# Patient Record
Sex: Female | Born: 1980 | Hispanic: No | Marital: Married | Smoking: Never smoker
Health system: Southern US, Community
[De-identification: ages and names within clinical notes are randomized; demographics above are authoritative.]

## PROBLEM LIST (undated history)

## (undated) DIAGNOSIS — O009 Unspecified ectopic pregnancy without intrauterine pregnancy: Secondary | ICD-10-CM

## (undated) DIAGNOSIS — D259 Leiomyoma of uterus, unspecified: Secondary | ICD-10-CM

## (undated) DIAGNOSIS — R0600 Dyspnea, unspecified: Secondary | ICD-10-CM

## (undated) DIAGNOSIS — D649 Anemia, unspecified: Secondary | ICD-10-CM

## (undated) HISTORY — PX: LAPAROSCOPIC SALPINGOOPHERECTOMY: SUR795

## (undated) HISTORY — DX: Leiomyoma of uterus, unspecified: D25.9

## (undated) HISTORY — DX: Unspecified ectopic pregnancy without intrauterine pregnancy: O00.90

---

## 2017-02-09 ENCOUNTER — Emergency Department (HOSPITAL_COMMUNITY)
Admission: EM | Admit: 2017-02-09 | Discharge: 2017-02-09 | Disposition: A | Discharge status: Home / Self Care | Payer: Medicaid Other | Attending: Emergency Medicine | Admitting: Emergency Medicine

## 2017-02-09 ENCOUNTER — Other Ambulatory Visit: Payer: Self-pay

## 2017-02-09 ENCOUNTER — Encounter (HOSPITAL_COMMUNITY): Payer: Self-pay | Admitting: Emergency Medicine

## 2017-02-09 ENCOUNTER — Encounter (HOSPITAL_COMMUNITY): Payer: Self-pay | Admitting: Neurology

## 2017-02-09 ENCOUNTER — Emergency Department (HOSPITAL_COMMUNITY)
Admission: EM | Admit: 2017-02-09 | Discharge: 2017-02-09 | Disposition: A | Discharge status: Home / Self Care | Payer: Medicaid Other | Source: Home / Self Care | Attending: Emergency Medicine | Admitting: Emergency Medicine

## 2017-02-09 ENCOUNTER — Emergency Department (HOSPITAL_COMMUNITY): Payer: Medicaid Other

## 2017-02-09 DIAGNOSIS — R05 Cough: Secondary | ICD-10-CM | POA: Diagnosis not present

## 2017-02-09 DIAGNOSIS — J069 Acute upper respiratory infection, unspecified: Secondary | ICD-10-CM | POA: Insufficient documentation

## 2017-02-09 DIAGNOSIS — Z5321 Procedure and treatment not carried out due to patient leaving prior to being seen by health care provider: Secondary | ICD-10-CM | POA: Diagnosis not present

## 2017-02-09 MED ORDER — BENZONATATE 100 MG PO CAPS: 100.0000 mg | ORAL_CAPSULE | Freq: Three times a day (TID) | ORAL | 0 refills | Status: DC

## 2017-02-09 MED ORDER — AZITHROMYCIN 250 MG PO TABS: 250.0000 mg | ORAL_TABLET | Freq: Every day | ORAL | 0 refills | Status: DC

## 2017-02-09 NOTE — ED Notes (Signed)
No answer for xray x2

## 2017-02-09 NOTE — ED Notes (Signed)
No response when called for vitals.  

## 2017-02-09 NOTE — ED Triage Notes (Signed)
Pt reports sick for 1 week, coughing, sneezing. Went to work, was coughing, saw some blood. Came here last night, but didn't wait b/c of the wait.

## 2017-02-09 NOTE — ED Notes (Signed)
Pt still not in waiting room

## 2017-02-09 NOTE — ED Provider Notes (Signed)
[facility]Y DEPARTMENT Provider Note   CSN: [id number] Arrival date & time: 02/09/17  1011     History   Chief Complaint No chief complaint on file.   HPI Becky Larson is a 36 y.o. female.  HPI  36 year old female presents today with complaints of upper respiratory infection.  Patient reports approximately 1 week ago she developed cough, rhinorrhea, congestion and sneezing.  She notes the symptoms have persisted with no worsening.  She notes that her nasal congestion, fatigue and rhinorrhea has dramatically improved but continues to have a cough.  She reports this is slightly productive, reports some minor associated shortness of breath with cough.  She denies any fevers at home.  She was told that she needed to come to the emergency room by her primary care and by her work.  Patient denies a history of asthma or allergies, she reports family members were sick with the same symptoms and have improved.  Patient denies any history of DVT or PE, denies any significant risk factors.  History reviewed. No pertinent past medical history.  There are no active problems to display for this patient.   Past Surgical History:  Procedure Laterality Date  . TUBAL LIGATION      OB History    No data available       Home Medications    Prior to Admission medications   Medication Sig Start Date End Date Taking? Authorizing Provider  azithromycin (ZITHROMAX) 250 MG tablet Take 1 tablet (250 mg total) by mouth daily. Take first 2 tablets together, then 1 every day until finished. 02/09/17   [place] Depaz, Dellis Filbert, PA-C  benzonatate (TESSALON) 100 MG capsule Take 1 capsule (100 mg total) by mouth every 8 (eight) hours. 02/09/17   Okey Regal, PA-C    Family History No family history on file.  Social History Social History   Tobacco Use  . Smoking status: Never Smoker  . Smokeless tobacco: Never Used  Substance Use Topics  . Alcohol use: Yes    Comment:  socially  . Drug use: No     Allergies   Morphine and related   Review of Systems Review of Systems  All other systems reviewed and are negative.    Physical Exam Updated Vital Signs BP 132/83 (BP Location: Right Arm)   Pulse (!) 105   Temp 98.3 F (36.8 C) (Oral)   Resp 18   Ht 5\' 6"  (1.676 m)   Wt 79.4 kg (175 lb)   LMP 01/19/2017   SpO2 98%   BMI 28.25 kg/m   Physical Exam  Constitutional: She is oriented to person, place, and time. She appears well-developed and well-nourished.  HENT:  Head: Normocephalic and atraumatic.  Right Ear: External ear normal.  Left Ear: External ear normal.  Nose: Nose normal.  Mouth/Throat: Oropharynx is clear and moist. No oropharyngeal exudate.  TMs normal bilateral, rhinorrhea present  Eyes: Conjunctivae are normal. Pupils are equal, round, and reactive to light. Right eye exhibits no discharge. Left eye exhibits no discharge. No scleral icterus.  Neck: Normal range of motion. No JVD present. No tracheal deviation present.  Pulmonary/Chest: Effort normal and breath sounds normal. No stridor. No respiratory distress. She has no wheezes. She has no rales.  Musculoskeletal: She exhibits no edema.  Neurological: She is alert and oriented to person, place, and time. Coordination normal.  Skin: Skin is warm.  Psychiatric: She has a normal mood and affect. Her behavior is normal. Judgment and thought  content normal.  Nursing note and vitals reviewed.    ED Treatments / Results  Labs (all labs ordered are listed, but only abnormal results are displayed) Labs Reviewed - No data to display  EKG  EKG Interpretation None       Radiology Dg Chest 2 View  Result Date: 02/09/2017 CLINICAL DATA:  Cough, shortness of breath EXAM: CHEST  2 VIEW COMPARISON:  None. FINDINGS: Lungs are clear.  No pleural effusion or pneumothorax. The heart is normal in size. Visualized osseous structures are within normal limits. IMPRESSION: Normal chest  radiographs. Electronically Signed   By: Julian Hy M.D.   On: 02/09/2017 12:26    Procedures Procedures (including critical care time)  Medications Ordered in ED Medications - No data to display   Initial Impression / Assessment and Plan / ED Course  I have reviewed the triage vital signs and the nursing notes.  Pertinent labs & imaging results that were available during my care of the patient were reviewed by me and considered in my medical decision making (see chart for details).      Final Clinical Impressions(s) / ED Diagnoses   Final diagnoses:  Upper respiratory tract infection, unspecified type    36 year old female presents today with upper respiratory infection.  She has had 7 days of symptoms, the symptoms seem to be improving with continued productive cough.  Chest x-ray normal, lungs sound clear, reassuring vital signs.  Patient will be given a prescription for antibiotics, encouraged to hold for another 3 days if symptoms do not worsen, return to the emergency room for any new or worsening signs or symptoms with initiation of antibiotic therapy.  Check return precautions, she verbalized understanding and agreement to this plan had no further questions or concerns at the time of discharge.  ED Discharge Orders        Ordered    benzonatate (TESSALON) 100 MG capsule  Every 8 hours     02/09/17 1421    azithromycin (ZITHROMAX) 250 MG tablet  Daily     02/09/17 1422       Okey Regal, PA-C 02/09/17 1423    Mabe, Forbes Cellar, MD 02/09/17 1446

## 2017-02-09 NOTE — Discharge Instructions (Signed)
Please read attached information. If you experience any new or worsening signs or symptoms please return to the emergency room for evaluation. Please follow-up with your primary care provider or specialist as discussed. Please use medication prescribed only as directed and discontinue taking if you have any concerning signs or symptoms.   °

## 2017-02-09 NOTE — ED Notes (Signed)
Called for xray-- no response

## 2017-02-09 NOTE — ED Triage Notes (Signed)
Pt reports nasal congestion, body aches, cough, X few days.

## 2017-02-09 NOTE — ED Notes (Signed)
Pt verbalized understanding discharge instructions and denies any further needs or questions at this time. VS stable, ambulatory and steady gait.   

## 2017-02-09 NOTE — ED Notes (Signed)
No response from pt in lobby 

## 2017-02-09 NOTE — ED Notes (Signed)
Call no answer 

## 2017-11-13 ENCOUNTER — Emergency Department (HOSPITAL_COMMUNITY)
Admission: EM | Admit: 2017-11-13 | Discharge: 2017-11-13 | Disposition: A | Discharge status: Home / Self Care | Payer: Medicaid Other | Attending: Emergency Medicine | Admitting: Emergency Medicine

## 2017-11-13 ENCOUNTER — Emergency Department (HOSPITAL_COMMUNITY)
Admission: EM | Admit: 2017-11-13 | Discharge: 2017-11-14 | Disposition: A | Discharge status: Home / Self Care | Payer: Medicaid Other | Source: Home / Self Care | Attending: Emergency Medicine | Admitting: Emergency Medicine

## 2017-11-13 ENCOUNTER — Encounter (HOSPITAL_COMMUNITY): Payer: Self-pay | Admitting: Emergency Medicine

## 2017-11-13 ENCOUNTER — Emergency Department (HOSPITAL_COMMUNITY): Payer: Medicaid Other

## 2017-11-13 DIAGNOSIS — N939 Abnormal uterine and vaginal bleeding, unspecified: Secondary | ICD-10-CM

## 2017-11-13 DIAGNOSIS — D259 Leiomyoma of uterus, unspecified: Secondary | ICD-10-CM | POA: Diagnosis not present

## 2017-11-13 DIAGNOSIS — N938 Other specified abnormal uterine and vaginal bleeding: Secondary | ICD-10-CM

## 2017-11-13 DIAGNOSIS — D649 Anemia, unspecified: Secondary | ICD-10-CM | POA: Insufficient documentation

## 2017-11-13 DIAGNOSIS — R102 Pelvic and perineal pain: Secondary | ICD-10-CM | POA: Diagnosis not present

## 2017-11-13 DIAGNOSIS — Z5321 Procedure and treatment not carried out due to patient leaving prior to being seen by health care provider: Secondary | ICD-10-CM

## 2017-11-13 LAB — URINALYSIS, ROUTINE W REFLEX MICROSCOPIC
BILIRUBIN URINE: NEGATIVE
GLUCOSE, UA: NEGATIVE mg/dL
KETONES UR: NEGATIVE mg/dL
Leukocytes, UA: NEGATIVE
Nitrite: NEGATIVE
PH: 6 (ref 5.0–8.0)
Protein, ur: NEGATIVE mg/dL

## 2017-11-13 LAB — BASIC METABOLIC PANEL
ANION GAP: 7 (ref 5–15)
BUN: 9 mg/dL (ref 6–20)
CALCIUM: 8.7 mg/dL — AB (ref 8.9–10.3)
CO2: 27 mmol/L (ref 22–32)
Chloride: 104 mmol/L (ref 98–111)
Creatinine, Ser: 0.61 mg/dL (ref 0.44–1.00)
GFR calc Af Amer: >60 mL/min (ref >60)
GLUCOSE: 92 mg/dL (ref 70–99)
Potassium: 3.6 mmol/L (ref 3.5–5.1)
Sodium: 138 mmol/L (ref 135–145)

## 2017-11-13 LAB — CBC WITH DIFFERENTIAL/PLATELET
BASOS PCT: 0 %
Basophils Absolute: 0 10*3/uL (ref 0.0–0.1)
Basophils Absolute: 0 10*3/uL (ref 0.0–0.1)
Basophils Relative: 0 %
EOS ABS: 0.3 10*3/uL (ref 0.0–0.7)
EOS PCT: 3 %
EOS PCT: 4 %
Eosinophils Absolute: 0.4 10*3/uL (ref 0.0–0.7)
HCT: 26.3 % — ABNORMAL LOW (ref 36.0–46.0)
HEMATOCRIT: 23.8 % — AB (ref 36.0–46.0)
Hemoglobin: 7.7 g/dL — ABNORMAL LOW (ref 12.0–15.0)
Hemoglobin: 7.1 g/dL — ABNORMAL LOW (ref 12.0–15.0)
LYMPHS ABS: 1.9 10*3/uL (ref 0.7–4.0)
LYMPHS PCT: 20 %
Lymphocytes Relative: 27 %
Lymphs Abs: 2.5 10*3/uL (ref 0.7–4.0)
MCH: 19.7 pg — AB (ref 26.0–34.0)
MCH: 20.1 pg — AB (ref 26.0–34.0)
MCHC: 29.3 g/dL — AB (ref 30.0–36.0)
MCHC: 29.8 g/dL — ABNORMAL LOW (ref 30.0–36.0)
MCV: 67.3 fL — AB (ref 78.0–100.0)
MCV: 67.2 fL — AB (ref 78.0–100.0)
MONO ABS: 0.5 10*3/uL (ref 0.1–1.0)
MONOS PCT: 5 %
MONOS PCT: 5 %
Monocytes Absolute: 0.5 10*3/uL (ref 0.1–1.0)
NEUTROS PCT: 64 %
Neutro Abs: 6.6 10*3/uL (ref 1.7–7.7)
Neutro Abs: 5.7 10*3/uL (ref 1.7–7.7)
Neutrophils Relative %: 72 %
PLATELETS: 382 10*3/uL (ref 150–400)
Platelets: 360 10*3/uL (ref 150–400)
RBC: 3.91 MIL/uL (ref 3.87–5.11)
RBC: 3.54 MIL/uL — AB (ref 3.87–5.11)
RDW: 19.5 % — AB (ref 11.5–15.5)
RDW: 19.3 % — ABNORMAL HIGH (ref 11.5–15.5)
WBC: 9.4 10*3/uL (ref 4.0–10.5)
WBC: 9.1 10*3/uL (ref 4.0–10.5)

## 2017-11-13 LAB — WET PREP, GENITAL
Sperm: NONE SEEN
Trich, Wet Prep: NONE SEEN
Yeast Wet Prep HPF POC: NONE SEEN

## 2017-11-13 LAB — HCG, QUANTITATIVE, PREGNANCY

## 2017-11-13 LAB — URINALYSIS, MICROSCOPIC (REFLEX)

## 2017-11-13 LAB — POC URINE PREG, ED: Preg Test, Ur: NEGATIVE

## 2017-11-13 LAB — PREPARE RBC (CROSSMATCH)

## 2017-11-13 MED ORDER — SODIUM CHLORIDE 0.9% IV SOLUTION
Freq: Once | INTRAVENOUS | Status: AC
  Administered 2017-11-13: 22:00:00 via INTRAVENOUS

## 2017-11-13 MED ORDER — STERILE WATER FOR INJECTION IJ SOLN
INTRAMUSCULAR | Status: AC
  Filled 2017-11-13: qty 10

## 2017-11-13 MED ORDER — MORPHINE SULFATE (PF) 4 MG/ML IV SOLN
4.0000 mg | Freq: Once | INTRAVENOUS | Status: AC
  Administered 2017-11-13: 4 mg via INTRAVENOUS
  Filled 2017-11-13: qty 1

## 2017-11-13 MED ORDER — DOCUSATE SODIUM 100 MG PO CAPS: 100.0000 mg | ORAL_CAPSULE | Freq: Two times a day (BID) | ORAL | 1 refills | Status: DC

## 2017-11-13 MED ORDER — TRANEXAMIC ACID 650 MG PO TABS: 1300.0000 mg | ORAL_TABLET | Freq: Three times a day (TID) | ORAL | 0 refills | Status: DC

## 2017-11-13 MED ORDER — MEGESTROL ACETATE 40 MG PO TABS
40.0000 mg | ORAL_TABLET | Freq: Every day | ORAL | Status: DC
  Administered 2017-11-13: 40 mg via ORAL
  Filled 2017-11-13 (×2): qty 1

## 2017-11-13 MED ORDER — FERROUS SULFATE 325 (65 FE) MG PO TABS: 325.0000 mg | ORAL_TABLET | Freq: Every day | ORAL | 1 refills | Status: DC

## 2017-11-13 MED ORDER — DIPHENHYDRAMINE HCL 50 MG/ML IJ SOLN
25.0000 mg | Freq: Once | INTRAMUSCULAR | Status: AC
  Administered 2017-11-14: 25 mg via INTRAVENOUS
  Filled 2017-11-13: qty 1

## 2017-11-13 MED ORDER — ESTROGENS CONJUGATED 25 MG IJ SOLR
25.0000 mg | Freq: Once | INTRAMUSCULAR | Status: DC
  Filled 2017-11-13: qty 25

## 2017-11-13 MED ORDER — LACTATED RINGERS IV BOLUS
1000.0000 mL | Freq: Once | INTRAVENOUS | Status: AC
  Administered 2017-11-13: 1000 mL via INTRAVENOUS

## 2017-11-13 MED ORDER — SODIUM CHLORIDE 0.9 % IV BOLUS
1000.0000 mL | Freq: Once | INTRAVENOUS | Status: AC
  Administered 2017-11-13: 1000 mL via INTRAVENOUS

## 2017-11-13 MED ORDER — ESTROGENS CONJUGATED 25 MG IJ SOLR
25.0000 mg | Freq: Once | INTRAMUSCULAR | Status: AC
  Administered 2017-11-13: 25 mg via INTRAVENOUS
  Filled 2017-11-13 (×2): qty 25

## 2017-11-13 MED ORDER — SODIUM CHLORIDE 0.9 % IV BOLUS: 1000.0000 mL | Freq: Once | INTRAVENOUS | Status: DC

## 2017-11-13 MED ORDER — MEGESTROL ACETATE 40 MG PO TABS
120.0000 mg | ORAL_TABLET | Freq: Every day | ORAL | Status: DC
  Filled 2017-11-13: qty 3

## 2017-11-13 NOTE — ED Provider Notes (Addendum)
Patient accepted at signout from Dr. Dayna Barker.  Plan to follow-up on ultrasound results and reassess patient for disposition.  Dr. Dayna Barker had ordered Megace, Premarin and 1 unit of blood based on consultation with Dr. Ilda Basset at [facility].  At this time (18:44), those have not yet been administered or blood started.Marland Kitchen Physical Exam  BP 108/66 (BP Location: Right Arm)   Pulse 91   Temp 98 F (36.7 C) (Oral)   Resp 16   SpO2 100%   Physical Exam  Constitutional: She is oriented to person, place, and time. She appears well-developed and well-nourished. No distress.  HENT:  Head: Normocephalic and atraumatic.  Eyes: EOM are normal.  Pulmonary/Chest: Effort normal.  Abdominal:  Mild lower abdominal distention with palpable uterus below the umbilicus slightly firm.  Mild tenderness.  No guarding.  Patient is wearing menstrual pad with red blood that is been absorbed but no large clot or continuous trickling of blood.  Musculoskeletal: Normal range of motion.  Neurological: She is oriented to person, place, and time. She exhibits normal muscle tone. Coordination normal.  Skin: Skin is warm and dry. There is pallor.  Psychiatric: She has a normal mood and affect.    ED Course/Procedures   Clinical Course as of Nov 13 2129  Sat Nov 13, 2017  4193 I have reassured the patient and gone over the risks and benefits of the transfusion.  She is now agreeable to taking the blood.  Demeanor is pleasant and agreeable.  She was fearful of possibility of transmission of HIV or hepatitis.   [MP]    Clinical Course User Index [MP] Charlesetta Shanks, MD    Procedures   Consult: Reviewed with Dr. Ilda Basset with ultrasound result.  As the Premarin and Megace and blood have not been administered at this time, he advises to continue with that plan and reassess the patient.  Ultrasound findings may well represent fibroid or polyp.  He advises to recontact him once the medications of blood have finished to determine if  bleeding has diminished significantly patient may be suitable for outpatient follow-up. MDM  (18: 76) patient's nurse just advises me that the patient is currently refusing her blood transfusion.  Will go and review this with the patient.  At my first evaluation, patient is pleasant and agreeable.  Did not advise me that she was going to refuse blood transfusion when discussed.   (21: 45) patient's nurse who took over care, reports that blood has just recently been hung.  She reports that type and screen had apparently not been done by the previous nurse provider.  Megace has just come up from the pharmacy.  Patient is only just now getting the Megace.  Nurse reports patient requested something for pelvic pain.  Morphine 4 mg IV added order set. (23: 47) Blood is nearly finished.  Patient has some itching with morphine.  She has one, 1 cm hive on her abdomen.  No erythema and no respiratory distress.  She is alert and clinically well in appearance.  Will give 1 dose Benadryl for itching.  She reports she has continued to have some bleeding she changes pads. No large clots. (00: 35) patient has tolerated blood without adverse effect.  Benadryl relieved itching.  She is alert and appropriate with no respiratory distress.  Plan will be for observation post blood transfusion for minimum of 30 minutes.  I have reviewed with patient plan for follow-up with women[facility] outpatient clinic to call on Monday.  Return precautions  reviewed.  Confirm with Dr. Ilda Basset, patient is to have tranexamic acid 3 times daily for 5 days.  I have modified prescription for dispensation of 15 pills. Charlesetta Shanks, MD 11/13/17 1850    Charlesetta Shanks, MD 11/14/17 4039    Charlesetta Shanks, MD 11/14/17 [phone]

## 2017-11-13 NOTE — ED Triage Notes (Signed)
Patient here from home with complaints of heavy vaginal bleeding. States that last month she missed her period and had a positive pregnancy test. Currently bleeding heavily and passing clots.

## 2017-11-13 NOTE — ED Provider Notes (Signed)
Emergency Department Provider Note   I have reviewed the triage vital signs and the nursing notes.   HISTORY  Chief Complaint Vaginal Bleeding and Threatened Miscarriage   HPI Becky Larson is a 37 y.o. female with a history of tubal ligation who had a positive pregnancy test last month after she did not have it..  She states that she normally has pretty heavy periods and her normal cycle starts her on the 20th the 21st.  This month she started having vaginal bleeding on the 21st and has been going on ever since then.  She had large clots that of passing a large amount of bleeding.  Yesterday she did have an episode of dizziness but otherwise has been asymptomatic.  Has not taken another pregnancy test. No other associated or modifying symptoms.    History reviewed. No pertinent past medical history.  There are no active problems to display for this patient.   Past Surgical History:  Procedure Laterality Date  . TUBAL LIGATION        Allergies Morphine and related  No family history on file.  Social History Social History   Tobacco Use  . Smoking status: Never Smoker  . Smokeless tobacco: Never Used  Substance Use Topics  . Alcohol use: Yes    Comment: socially  . Drug use: No    Review of Systems  All other systems negative except as documented in the HPI. All pertinent positives and negatives as reviewed in the HPI. ____________________________________________   PHYSICAL EXAM:  VITAL SIGNS: ED Triage Vitals [11/13/17 0847]  Enc Vitals Group     BP 123/76     Pulse Rate 86     Resp 18     Temp 97.9 F (36.6 C)     Temp Source Oral     SpO2 99 %    Constitutional: Alert and oriented. Well appearing and in no acute distress. Eyes: Conjunctivae are normal. PERRL. EOMI. Head: Atraumatic. Nose: No congestion/rhinnorhea. Mouth/Throat: Mucous membranes are moist.  Oropharynx non-erythematous. Neck: No stridor.  No meningeal signs.    Cardiovascular: Normal rate, regular rhythm. Good peripheral circulation. Grossly normal heart sounds.   Respiratory: Normal respiratory effort.  No retractions. Lungs CTAB. Gastrointestinal: Soft and nontender. No distention.  Musculoskeletal: No lower extremity tenderness nor edema. No gross deformities of extremities. Neurologic:  Normal speech and language. No gross focal neurologic deficits are appreciated.  Skin:  Skin is warm, dry and intact. No rash noted.   ____________________________________________   LABS (all labs ordered are listed, but only abnormal results are displayed)  Labs Reviewed  URINALYSIS, ROUTINE W REFLEX MICROSCOPIC - Abnormal; Notable for the following components:      Result Value   Specific Gravity, Urine >1.030 (*)    Hgb urine dipstick MODERATE (*)    All other components within normal limits  CBC WITH DIFFERENTIAL/PLATELET - Abnormal; Notable for the following components:   Hemoglobin 7.7 (*)    HCT 26.3 (*)    MCV 67.3 (*)    MCH 19.7 (*)    MCHC 29.3 (*)    RDW 19.5 (*)    All other components within normal limits  BASIC METABOLIC PANEL - Abnormal; Notable for the following components:   Calcium 8.7 (*)    All other components within normal limits  URINALYSIS, MICROSCOPIC (REFLEX) - Abnormal; Notable for the following components:   Bacteria, UA FEW (*)    All other components within normal limits  URINE CULTURE  HCG, QUANTITATIVE, PREGNANCY  POC URINE PREG, ED  TYPE AND SCREEN   ____________________________________________  INITIAL IMPRESSION / ASSESSMENT AND PLAN / ED COURSE  Will see if pregnant and decide course of action from there. No e/o hemorrhagic shock currently but if she has really had amount of bleeding that she states she has I will check a CBC.  Patient's hemoglobin is 7.7.  Will order Premarin and Megace.  On reevaluation patient had eloped.  I called and left a voicemail for her to come back.  Pertinent labs &  imaging results that were available during my care of the patient were reviewed by me and considered in my medical decision making (see chart for details).  ____________________________________________  FINAL CLINICAL IMPRESSION(S) / ED DIAGNOSES  Final diagnoses:  Dysfunctional uterine bleeding  Eloped from emergency department     MEDICATIONS GIVEN DURING THIS VISIT:  Medications  lactated ringers bolus 1,000 mL (0 mLs Intravenous Stopped 11/13/17 1305)     NEW OUTPATIENT MEDICATIONS STARTED DURING THIS VISIT:  There are no discharge medications for this patient.   Note:  This note was prepared with assistance of Dragon voice recognition software. Occasional wrong-word or sound-a-like substitutions may have occurred due to the inherent limitations of voice recognition software.   Merrily Pew, MD 11/13/17 [phone]

## 2017-11-13 NOTE — Discharge Instructions (Addendum)
1.  Return to the emergency department immediately if you have lightheadedness, feel like he will pass out, increasing bleeding. 2.  Call [facility] outpatient clinic Monday.  Let them know you are seen in the emergency department and you are to have follow-up for bleeding that required blood transfusion. 3.  Fill your prescriptions and start your medications tomorrow morning. You must be seen by a Gynecologist early this week to guide your medications for bleeding.

## 2017-11-13 NOTE — ED Provider Notes (Signed)
Emergency Department Provider Note   I have reviewed the triage vital signs and the nursing notes.   HISTORY  Chief Complaint Abnormal Lab   HPI Becky Larson is a 37 y.o. female who returns after elopement earlier in day.   She has a history of tubal ligation who had a positive pregnancy test last month after she did not have it..  She states that she normally has pretty heavy periods and her normal cycle starts her on the 20th the 21st.  This month she started having vaginal bleeding on the 21st and has been going on ever since then.  She had large clots that of passing a large amount of bleeding.  Yesterday she did have an episode of dizziness but otherwise has been asymptomatic.  Has not taken another pregnancy test.  Since leaving the emergency department she still has some abdominal cramping and persistent bleeding and clots at home.  She still feels little bit dizzy.   No other associated or modifying symptoms.    History reviewed. No pertinent past medical history.  There are no active problems to display for this patient.   Past Surgical History:  Procedure Laterality Date  . TUBAL LIGATION        Allergies Morphine and related  No family history on file.  Social History Social History   Tobacco Use  . Smoking status: Never Smoker  . Smokeless tobacco: Never Used  Substance Use Topics  . Alcohol use: Yes    Comment: socially  . Drug use: No    Review of Systems  All other systems negative except as documented in the HPI. All pertinent positives and negatives as reviewed in the HPI. ____________________________________________   PHYSICAL EXAM:  VITAL SIGNS: Blood pressure 112/69, pulse 77, temperature 98.1 F (36.7 C), temperature source Oral, resp. rate 16, SpO2 100 %.   Constitutional: Alert and oriented. Well appearing and in no acute distress. Eyes: Conjunctivae are normal. PERRL. EOMI. Head: Atraumatic. Nose: No  congestion/rhinnorhea. Mouth/Throat: Mucous membranes are moist.  Oropharynx non-erythematous. Neck: No stridor.  No meningeal signs.   Cardiovascular: Normal rate, regular rhythm. Good peripheral circulation. Grossly normal heart sounds.   Respiratory: Normal respiratory effort.  No retractions. Lungs CTAB. Gastrointestinal: Soft and nontender. No distention.  Musculoskeletal: No lower extremity tenderness nor edema. No gross deformities of extremities. Neurologic:  Normal speech and language. No gross focal neurologic deficits are appreciated.  Skin:  Skin is warm, dry and intact. No rash noted. GU: blood in vault, mild oozing from os. No cmt. No friable cervix or obvious masses.  ____________________________________________   LABS (all labs ordered are listed, but only abnormal results are displayed)  Labs Reviewed  WET PREP, GENITAL - Abnormal; Notable for the following components:      Result Value   Clue Cells Wet Prep HPF POC PRESENT (*)    WBC, Wet Prep HPF POC MANY (*)    All other components within normal limits  CBC WITH DIFFERENTIAL/PLATELET - Abnormal; Notable for the following components:   RBC 3.54 (*)    Hemoglobin 7.1 (*)    HCT 23.8 (*)    MCV 67.2 (*)    MCH 20.1 (*)    MCHC 29.8 (*)    RDW 19.3 (*)    All other components within normal limits  PREPARE RBC (CROSSMATCH)  ABO/RH  GC/CHLAMYDIA PROBE AMP ([place]) NOT AT [facility]   ____________________________________________   RADIOLOGY  US Pelvis Complete  Result Date:  11/13/2017 CLINICAL DATA:  Abnormal bleeding.  Evaluate for fibroids EXAM: TRANSABDOMINAL ULTRASOUND OF PELVIS TECHNIQUE: Transabdominal ultrasound examination of the pelvis was performed including evaluation of the uterus, ovaries, adnexal regions, and pelvic cul-de-sac. COMPARISON:  None. FINDINGS: Uterus Measurements: 15.8 x 9.3 x 9.7 cm. Heterogeneous echotexture. Focal intramural fundal fibroid anteriorly measures up to 2.6 cm.  Endometrium Thickness: Markedly thickened, measuring up to 6.4 cm in thickness. Heterogeneous mass noted within the endometrium measures 8.2 x 7.1 x 5.8 cm. Internal blood flow noted. Right ovary Measurements: Not visualized.  No adnexal mass seen. Left ovary Measurements: 4.6 x 2.4 x 2.5 cm. Normal appearance/no adnexal mass. Other findings:  No abnormal free fluid. IMPRESSION: Enlarged, heterogeneous myometrium. Focal fundal fibroid measures 2.6 cm. Large heterogeneous endometrial mass measuring up to 8.2 cm with internal blood flow. Cannot exclude endometrial cancer. Recommend tissue sampling. Electronically Signed   By: Rolm Baptise M.D.   On: 11/13/2017 18:08    ____________________________________________   PROCEDURES  Procedure(s) performed:   Procedures  CRITICAL CARE Performed by: Merrily Pew Total critical care time: 35 minutes Critical care time was exclusive of separately billable procedures and treating other patients. Critical care was necessary to treat or prevent imminent or life-threatening deterioration. Critical care was time spent personally by me on the following activities: development of treatment plan with patient and/or surrogate as well as nursing, discussions with consultants, evaluation of patient's response to treatment, examination of patient, obtaining history from patient or surrogate, ordering and performing treatments and interventions, ordering and review of laboratory studies, ordering and review of radiographic studies, pulse oximetry and re-evaluation of patient's condition.  ____________________________________________   INITIAL IMPRESSION / ASSESSMENT AND PLAN / ED COURSE  Patient's repeat hemoglobin 7.1 and continuing to bleed.  Will start Megace and Premarin and a unit of blood.  Recommended admission to the patient however she really does not want to be admitted.  Compromised and said that if the bleeding stopped and her hemoglobin stayed stable and  she fell the case then she can be discharged.  I discussed with gynecology, after [place] who suggest to send her home on iron and TXA 1300 mg 3 times a day for 5 days but to also do an ultrasound to evaluate for causes of bleeding. At time of transfer of care pending blood and medicines and ultrasound.  Pertinent labs & imaging results that were available during my care of the patient were reviewed by me and considered in my medical decision making (see chart for details).  ____________________________________________  FINAL CLINICAL IMPRESSION(S) / ED DIAGNOSES  Final diagnoses:  Vaginal bleeding  Symptomatic anemia     MEDICATIONS GIVEN DURING THIS VISIT:  Medications  conjugated estrogens (PREMARIN) injection 25 mg (25 mg Intravenous Given 11/13/17 1843)  sodium chloride 0.9 % bolus 1,000 mL (0 mLs Intravenous Stopped 11/13/17 1707)  0.9 %  sodium chloride infusion (Manually program via Guardrails IV Fluids) ( Intravenous Stopped 11/14/17 0142)  morphine 4 MG/ML injection 4 mg (4 mg Intravenous Given 11/13/17 2142)  diphenhydrAMINE (BENADRYL) injection 25 mg (25 mg Intravenous Given 11/14/17 0020)     NEW OUTPATIENT MEDICATIONS STARTED DURING THIS VISIT:  Discharge Medication List as of 11/14/2017  1:09 AM    START taking these medications   Details  docusate sodium (COLACE) 100 MG capsule Take 1 capsule (100 mg total) by mouth 2 (two) times daily. While on iron, Starting Sat 11/13/2017, Print    ferrous sulfate 325 (65 FE) MG tablet Take  1 tablet (325 mg total) by mouth daily., Starting Sat 11/13/2017, Print    tranexamic acid (LYSTEDA) 650 MG TABS tablet Take 2 tablets (1,300 mg total) by mouth 3 (three) times daily., Starting Sat 11/13/2017, Print        Note:  This note was prepared with assistance of Dragon voice recognition software. Occasional wrong-word or sound-a-like substitutions may have occurred due to the inherent limitations of voice recognition software.    Ninette Cotta, Corene Cornea, MD 11/14/17 [phone]

## 2017-11-13 NOTE — ED Notes (Addendum)
Pt states that she is having difficulty breathing-o2 sat at 66. Pt placed on 2L o2.  Pt moved to 94. Pulse monitoring at 48-manual measure 82

## 2017-11-13 NOTE — ED Notes (Signed)
Pelvic swabs completed by EDP [facility]- waiting for order to be placed before sending to lab

## 2017-11-13 NOTE — ED Notes (Signed)
Pt refused blood transfusion

## 2017-11-13 NOTE — ED Triage Notes (Signed)
Patient was here earlier and left AMA. Patient was called to come back due to low HGB.

## 2017-11-13 NOTE — ED Notes (Signed)
Patient left AMA.

## 2017-11-13 NOTE — ED Notes (Signed)
Patient left without notifying nursing staff. Pt pull her iv out and was found in the trash. M.d made aware.

## 2017-11-13 NOTE — ED Notes (Signed)
After EDP spoke with pt, pt room was found to be empty. Pt had pulled IV, placed in trash can and left w/o notifying staff or receiving d/c instructions. Primary RN notified

## 2017-11-14 LAB — ABO/RH: ABO/RH(D): O POS

## 2017-11-14 LAB — TYPE AND SCREEN
ABO/RH(D): O POS
ANTIBODY SCREEN: NEGATIVE
UNIT DIVISION: 0

## 2017-11-14 LAB — BPAM RBC
Blood Product Expiration Date: 201909202359
ISSUE DATE / TIME: 201908242059
Unit Type and Rh: 5100

## 2017-11-14 LAB — URINE CULTURE

## 2017-11-15 LAB — GC/CHLAMYDIA PROBE AMP (~~LOC~~) NOT AT ARMC
CHLAMYDIA, DNA PROBE: NEGATIVE
Neisseria Gonorrhea: NEGATIVE

## 2017-11-25 ENCOUNTER — Other Ambulatory Visit (HOSPITAL_COMMUNITY)
Admission: RE | Admit: 2017-11-25 | Discharge: 2017-11-25 | Disposition: A | Discharge status: Home / Self Care | Payer: Medicaid Other | Source: Ambulatory Visit | Attending: Obstetrics and Gynecology | Admitting: Obstetrics and Gynecology

## 2017-11-25 ENCOUNTER — Encounter (HOSPITAL_COMMUNITY): Payer: Self-pay

## 2017-11-25 ENCOUNTER — Encounter: Payer: Self-pay | Admitting: Obstetrics and Gynecology

## 2017-11-25 ENCOUNTER — Ambulatory Visit (INDEPENDENT_AMBULATORY_CARE_PROVIDER_SITE_OTHER): Payer: Medicaid Other | Admitting: Obstetrics and Gynecology

## 2017-11-25 VITALS — BP 110/67 | HR 92 | Ht 66.0 in | Wt 173.3 lb

## 2017-11-25 DIAGNOSIS — B3731 Acute candidiasis of vulva and vagina: Secondary | ICD-10-CM

## 2017-11-25 DIAGNOSIS — N760 Acute vaginitis: Secondary | ICD-10-CM | POA: Diagnosis not present

## 2017-11-25 DIAGNOSIS — N840 Polyp of corpus uteri: Secondary | ICD-10-CM

## 2017-11-25 DIAGNOSIS — N939 Abnormal uterine and vaginal bleeding, unspecified: Secondary | ICD-10-CM | POA: Insufficient documentation

## 2017-11-25 DIAGNOSIS — B9689 Other specified bacterial agents as the cause of diseases classified elsewhere: Secondary | ICD-10-CM | POA: Diagnosis not present

## 2017-11-25 DIAGNOSIS — B373 Candidiasis of vulva and vagina: Secondary | ICD-10-CM

## 2017-11-25 LAB — POCT PREGNANCY, URINE: Preg Test, Ur: NEGATIVE

## 2017-11-25 MED ORDER — FLUCONAZOLE 150 MG PO TABS: 150.0000 mg | ORAL_TABLET | Freq: Once | ORAL | 0 refills | Status: AC

## 2017-11-25 MED ORDER — NORETHIN ACE-ETH ESTRAD-FE 1-20 MG-MCG(24) PO CAPS: 1.0000 | ORAL_CAPSULE | Freq: Every day | ORAL | 0 refills | Status: DC

## 2017-11-25 MED ORDER — METRONIDAZOLE 500 MG PO TABS: 500.0000 mg | ORAL_TABLET | Freq: Two times a day (BID) | ORAL | 1 refills | Status: AC

## 2017-11-25 NOTE — Progress Notes (Signed)
Lysteda made the bleeding worse, when she stopped taking the meds her bleeding lightened up.

## 2017-11-25 NOTE — Procedures (Signed)
Endometrial Biopsy Procedure Note  Pre-operative Diagnosis: AUB, Fibroid uterus, ?polyp vs intrauterine fibroid  Post-operative Diagnosis: same  Procedure Details  Urine pregnancy test was done and result was negative.  The risks (including infection, bleeding, pain, and uterine perforation) and benefits of the procedure were explained to the patient and Written informed consent was obtained.  The patient was placed in the dorsal lithotomy position.  Bimanual exam showed the uterus to be in the anteroflexed position.  A Graves' speculum inserted in the vagina, and the cervix was visualized and a pap smear performed. The cervix was then prepped with povidone iodine, and a sharp tenaculum was applied to the anterior lip of the cervix for stabilization.  A pipelle was inserted into the uterine cavity and sounded the uterus to a depth of 9cm.  A Small-moderate amount of tissue and blood was collected after 2 passes. The sample was sent for pathologic examination.  Condition: Stable  Complications: None  Plan: The patient was advised to call for any fever or for prolonged or severe pain or bleeding. She was advised to use OTC analgesics as needed for mild to moderate pain. She was advised to avoid vaginal intercourse for 48 hours or until the bleeding has completely stopped.  Durene Romans MD Attending Center for [employer])

## 2017-11-25 NOTE — Progress Notes (Signed)
Obstetrics and Gynecology New Patient Evaluation  Appointment Date: 11/25/2017  OBGYN Clinic: [facility]  Primary Care Provider: Patient, No Pcp Per  Referring Provider: [facility]  Chief Complaint: AUB follow up  History of Present Illness: Becky Larson is a 37 y.o. African-American [phone] (Patient's last menstrual period was 11/10/2017 (exact date).), seen for the above chief complaint.   Patient went to [facility] ED on 8/24 for heavy vaginal bleeding. Patient received IV premarin, megace and 1U PRBC and was discharged to home with Lysteda. Pt states she only took the Lysteda for two days b/c she thought it actually made the bleeding worse. No bleeding since d/c from ED and no s/s of anemia, chest pain, sob, pain  Review of Systems: as noted in the History of Present Illness.  Past Medical History:  Past Medical History:  Diagnosis Date  . Ectopic pregnancy    Right    Past Surgical History:  Past Surgical History:  Procedure Laterality Date  . LAPAROSCOPIC SALPINGOOPHERECTOMY Right    ectopic    Past Obstetrical History:  OB History  Gravida Para Term Preterm AB Living  7 2 2  0 5 2  SAB TAB Ectopic Multiple Live Births  3 2 0 0 2    # Outcome Date GA Lbr Len/2nd Weight Sex Delivery Anes PTL Lv  7 Term           6 Term           5 SAB           4 SAB           3 SAB           2 TAB           1 TAB             Past Gynecological History: As per HPI. Periods: prior to most recent episode, regular, not that heavy or painful History of Pap Smear(s): Yes.   Last pap unsure, which have always been normal She is currently using nothing for contraception.   Social History:  Social History   Socioeconomic History  . Marital status: Married    Spouse name: Not on file  . Number of children: Not on file  . Years of education: Not on file  . Highest education level: Not on file  Occupational History  . Not on file  Social Needs  . Financial  resource strain: Not on file  . Food insecurity:    Worry: Not on file    Inability: Not on file  . Transportation needs:    Medical: Not on file    Non-medical: Not on file  Tobacco Use  . Smoking status: Never Smoker  . Smokeless tobacco: Never Used  Substance and Sexual Activity  . Alcohol use: Yes    Comment: socially  . Drug use: No  . Sexual activity: Yes    Birth control/protection: Surgical  Lifestyle  . Physical activity:    Days per week: Not on file    Minutes per session: Not on file  . Stress: Not on file  Relationships  . Social connections:    Talks on phone: Not on file    Gets together: Not on file    Attends religious service: Not on file    Active member of club or organization: Not on file    Attends meetings of clubs or organizations: Not on file    Relationship status: Not  on file  . Intimate partner violence:    Fear of current or ex partner: Not on file    Emotionally abused: Not on file    Physically abused: Not on file    Forced sexual activity: Not on file  Other Topics Concern  . Not on file  Social History Narrative  . Not on file    Family History: History reviewed. No pertinent family history. She denies any female cancers, bleeding or blood clotting disorders.    Medications Becky Larson. Becky Larson had no medications administered during this visit. Current Outpatient Medications  Medication Sig Dispense Refill  . ferrous sulfate 325 (65 FE) MG tablet Take 1 tablet (325 mg total) by mouth daily. 30 tablet 1  . docusate sodium (COLACE) 100 MG capsule Take 1 capsule (100 mg total) by mouth 2 (two) times daily. While on iron (Patient not taking: Reported on 11/25/2017) 60 capsule 1   No current facility-administered medications for this visit.     Allergies Morphine and related   Physical Exam:  BP 110/67   Pulse 92   Ht 5\' 6"  (1.676 m)   Wt 173 lb 4.8 oz (78.6 kg)   LMP 11/10/2017 (Exact Date)   BMI 27.97 kg/m  Body mass index is  27.97 kg/m. General appearance: Well nourished, well developed female in no acute distress.  Neck:  Supple, normal appearance, and no thyromegaly  Cardiovascular: normal s1 and s2.  No murmurs, rubs or gallops. Respiratory:  Clear to auscultation bilateral. Normal respiratory effort Abdomen: positive bowel sounds and no masses, hernias; diffusely non tender to palpation, non distended Neuro/Psych:  Normal mood and affect.  Skin:  Warm and dry.  Lymphatic:  No inguinal lymphadenopathy.   Pelvic exam: is not limited by body habitus EGBUS: within normal limits, Vagina: within normal limits and with minimal blood or discharge in the vault, Cervix: normal appearing cervix without tenderness, discharge or lesions. Uterus:  enlarged, c/w 12-14 week size and non tender, mobile and Adnexa:  normal adnexa and no mass, fullness, tenderness Rectovaginal: deferred  See procedure note for EMBx. Patient very intolerant of exam.   Laboratory: UPT neg  Radiology:  CLINICAL DATA:  Abnormal bleeding.  Evaluate for fibroids  EXAM: TRANSABDOMINAL ULTRASOUND OF PELVIS  TECHNIQUE: Transabdominal ultrasound examination of the pelvis was performed including evaluation of the uterus, ovaries, adnexal regions, and pelvic cul-de-sac.  COMPARISON:  None.  FINDINGS: Uterus  Measurements: 15.8 x 9.3 x 9.7 cm. Heterogeneous echotexture. Focal intramural fundal fibroid anteriorly measures up to 2.6 cm.  Endometrium  Thickness: Markedly thickened, measuring up to 6.4 cm in thickness. Heterogeneous mass noted within the endometrium measures 8.2 x 7.1 x 5.8 cm. Internal blood flow noted.  Right ovary  Measurements: Not visualized.  No adnexal mass seen.  Left ovary  Measurements: 4.6 x 2.4 x 2.5 cm. Normal appearance/no adnexal mass.  Other findings:  No abnormal free fluid.  IMPRESSION: Enlarged, heterogeneous myometrium. Focal fundal fibroid measures 2.6 cm.  Large heterogeneous  endometrial mass measuring up to 8.2 cm with internal blood flow. Cannot exclude endometrial cancer. Recommend tissue sampling.   Electronically Signed   By: Rolm Baptise M.D.   On: 11/13/2017 18:08  Assessment: pt stable  Plan:  1. Abnormal uterine bleeding Leading ddx is fibroid or polyp. D/w her that would recommend doing regular OCPs for now, after d/w her re: r/b/a. This will allow her to correct her anemia; pt also desires hysteroscopic procedure and not definitive therapy  or IUD. The OCPs will also help with hysteroscopic procedure. Request sent for hysteroscopy possible polypectomy, myomectomy for 6-8 weeks. Pt already has Rx from ED for irron and told to increase to bid with vitamin c.  - Cytology - PAP - Surgical pathology  2. BV (bacterial vaginosis) flagyl  3. Yeast infection involving the vagina and surrounding area diflucan  Orders Placed This Encounter  Procedures  . Pregnancy, urine POC    RTC PRN  Durene Romans MD Attending Center for [employer] [facility])

## 2017-11-26 ENCOUNTER — Encounter: Payer: Self-pay | Admitting: Obstetrics and Gynecology

## 2017-11-26 MED ORDER — FLUCONAZOLE 150 MG PO TABS: 150.0000 mg | ORAL_TABLET | Freq: Once | ORAL | 0 refills | Status: AC

## 2017-11-29 LAB — CYTOLOGY - PAP
DIAGNOSIS: NEGATIVE
HPV (WINDOPATH): DETECTED — AB
HPV 16/18/45 GENOTYPING: NEGATIVE

## 2017-12-06 ENCOUNTER — Encounter: Payer: Self-pay | Admitting: Obstetrics and Gynecology

## 2017-12-06 DIAGNOSIS — B977 Papillomavirus as the cause of diseases classified elsewhere: Secondary | ICD-10-CM | POA: Insufficient documentation

## 2017-12-29 ENCOUNTER — Other Ambulatory Visit: Payer: Self-pay

## 2017-12-29 ENCOUNTER — Encounter (HOSPITAL_BASED_OUTPATIENT_CLINIC_OR_DEPARTMENT_OTHER): Payer: Self-pay | Admitting: *Deleted

## 2017-12-29 NOTE — Progress Notes (Signed)
Pt states due to being out of shape she has difficulty taking a deep breath after taking stairs or exerting herself.  States it is an ongoing thing, but she is concerned that she won't "wake up" after surgery.  States she was considering cancelling surgery. Instructed patient to go to primary care doctor if respiratory symptoms are worrisome. She is just requesting to see anesthesiologist the day of preop lab apppointment to ask questions regarding sedation and if she is good to have surgery.

## 2018-01-04 ENCOUNTER — Encounter (HOSPITAL_BASED_OUTPATIENT_CLINIC_OR_DEPARTMENT_OTHER)
Admission: RE | Admit: 2018-01-04 | Discharge: 2018-01-04 | Disposition: A | Discharge status: Home / Self Care | Payer: Medicaid Other | Source: Ambulatory Visit | Attending: Obstetrics and Gynecology | Admitting: Obstetrics and Gynecology

## 2018-01-04 DIAGNOSIS — Z01812 Encounter for preprocedural laboratory examination: Secondary | ICD-10-CM | POA: Diagnosis not present

## 2018-01-04 DIAGNOSIS — N939 Abnormal uterine and vaginal bleeding, unspecified: Secondary | ICD-10-CM | POA: Diagnosis not present

## 2018-01-04 DIAGNOSIS — D250 Submucous leiomyoma of uterus: Secondary | ICD-10-CM | POA: Diagnosis not present

## 2018-01-04 DIAGNOSIS — Z885 Allergy status to narcotic agent status: Secondary | ICD-10-CM | POA: Diagnosis not present

## 2018-01-04 DIAGNOSIS — Z79899 Other long term (current) drug therapy: Secondary | ICD-10-CM | POA: Diagnosis not present

## 2018-01-04 DIAGNOSIS — D649 Anemia, unspecified: Secondary | ICD-10-CM | POA: Diagnosis not present

## 2018-01-04 LAB — CBC
HEMATOCRIT: 30.5 % — AB (ref 36.0–46.0)
HEMOGLOBIN: 8.6 g/dL — AB (ref 12.0–15.0)
MCH: 20.4 pg — ABNORMAL LOW (ref 26.0–34.0)
MCHC: 28.2 g/dL — ABNORMAL LOW (ref 30.0–36.0)
MCV: 72.3 fL — ABNORMAL LOW (ref 80.0–100.0)
NRBC: 0 % (ref 0.0–0.2)
Platelets: 488 10*3/uL — ABNORMAL HIGH (ref 150–400)
RBC: 4.22 MIL/uL (ref 3.87–5.11)
RDW: 20.1 % — AB (ref 11.5–15.5)
WBC: 8.1 10*3/uL (ref 4.0–10.5)

## 2018-01-04 LAB — POCT PREGNANCY, URINE: Preg Test, Ur: NEGATIVE

## 2018-01-04 NOTE — Progress Notes (Signed)
Anesthesia consult per Dr. Sabra Heck, will proceed with surgery as scheduled. Urine pregnancy test is negative, CBC obtained.

## 2018-01-05 ENCOUNTER — Ambulatory Visit (HOSPITAL_BASED_OUTPATIENT_CLINIC_OR_DEPARTMENT_OTHER): Payer: Medicaid Other | Admitting: Certified Registered"

## 2018-01-05 ENCOUNTER — Encounter (HOSPITAL_BASED_OUTPATIENT_CLINIC_OR_DEPARTMENT_OTHER)
Admission: RE | Disposition: A | Discharge status: Home / Self Care | Payer: Self-pay | Source: Ambulatory Visit | Attending: Obstetrics and Gynecology

## 2018-01-05 ENCOUNTER — Ambulatory Visit (HOSPITAL_BASED_OUTPATIENT_CLINIC_OR_DEPARTMENT_OTHER)
Admission: RE | Admit: 2018-01-05 | Discharge: 2018-01-05 | Disposition: A | Discharge status: Home / Self Care | Payer: Medicaid Other | Source: Ambulatory Visit | Attending: Obstetrics and Gynecology | Admitting: Obstetrics and Gynecology

## 2018-01-05 DIAGNOSIS — N939 Abnormal uterine and vaginal bleeding, unspecified: Secondary | ICD-10-CM | POA: Insufficient documentation

## 2018-01-05 DIAGNOSIS — Z79899 Other long term (current) drug therapy: Secondary | ICD-10-CM | POA: Diagnosis not present

## 2018-01-05 DIAGNOSIS — D649 Anemia, unspecified: Secondary | ICD-10-CM | POA: Insufficient documentation

## 2018-01-05 DIAGNOSIS — D259 Leiomyoma of uterus, unspecified: Secondary | ICD-10-CM | POA: Diagnosis not present

## 2018-01-05 DIAGNOSIS — D250 Submucous leiomyoma of uterus: Secondary | ICD-10-CM | POA: Insufficient documentation

## 2018-01-05 DIAGNOSIS — Z885 Allergy status to narcotic agent status: Secondary | ICD-10-CM | POA: Insufficient documentation

## 2018-01-05 DIAGNOSIS — Z9889 Other specified postprocedural states: Secondary | ICD-10-CM

## 2018-01-05 HISTORY — PX: HYSTEROSCOPY WITH D & C: SHX1775

## 2018-01-05 HISTORY — DX: Anemia, unspecified: D64.9

## 2018-01-05 HISTORY — DX: Dyspnea, unspecified: R06.00

## 2018-01-05 LAB — BASIC METABOLIC PANEL
Anion gap: 9 (ref 5–15)
BUN: 10 mg/dL (ref 6–20)
CHLORIDE: 114 mmol/L — AB (ref 98–111)
CO2: 19 mmol/L — AB (ref 22–32)
CREATININE: 0.71 mg/dL (ref 0.44–1.00)
Calcium: 7.5 mg/dL — ABNORMAL LOW (ref 8.9–10.3)
GFR calc Af Amer: >60 mL/min (ref >60)
GFR calc non Af Amer: >60 mL/min (ref >60)
GLUCOSE: 153 mg/dL — AB (ref 70–99)
POTASSIUM: 4.1 mmol/L (ref 3.5–5.1)
Sodium: 142 mmol/L (ref 135–145)

## 2018-01-05 LAB — CBC
HCT: 24.9 % — ABNORMAL LOW (ref 36.0–46.0)
HEMOGLOBIN: 6.9 g/dL — AB (ref 12.0–15.0)
MCH: 20.6 pg — ABNORMAL LOW (ref 26.0–34.0)
MCHC: 27.7 g/dL — AB (ref 30.0–36.0)
MCV: 74.3 fL — AB (ref 80.0–100.0)
Platelets: 335 10*3/uL (ref 150–400)
RBC: 3.35 MIL/uL — ABNORMAL LOW (ref 3.87–5.11)
RDW: 20.2 % — AB (ref 11.5–15.5)
WBC: 8.6 10*3/uL (ref 4.0–10.5)
nRBC: 0 % (ref 0.0–0.2)

## 2018-01-05 SURGERY — DILATATION AND CURETTAGE /HYSTEROSCOPY
Anesthesia: General

## 2018-01-05 MED ORDER — ONDANSETRON HCL 4 MG/2ML IJ SOLN
INTRAMUSCULAR | Status: AC
  Filled 2018-01-05: qty 2

## 2018-01-05 MED ORDER — LIDOCAINE HCL 1 % IJ SOLN
INTRAMUSCULAR | Status: DC | PRN
  Administered 2018-01-05: 20 mL

## 2018-01-05 MED ORDER — SCOPOLAMINE 1 MG/3DAYS TD PT72: 1.0000 | MEDICATED_PATCH | Freq: Once | TRANSDERMAL | Status: DC | PRN

## 2018-01-05 MED ORDER — EPHEDRINE SULFATE 50 MG/ML IJ SOLN
INTRAMUSCULAR | Status: DC | PRN
  Administered 2018-01-05: 15 mg via INTRAVENOUS
  Administered 2018-01-05: 10 mg via INTRAVENOUS

## 2018-01-05 MED ORDER — MIDAZOLAM HCL 2 MG/2ML IJ SOLN
INTRAMUSCULAR | Status: AC
  Filled 2018-01-05: qty 2

## 2018-01-05 MED ORDER — LIDOCAINE HCL (PF) 1 % IJ SOLN
INTRAMUSCULAR | Status: AC
  Filled 2018-01-05: qty 30

## 2018-01-05 MED ORDER — OXYCODONE-ACETAMINOPHEN 5-325 MG PO TABS: 1.0000 | ORAL_TABLET | Freq: Four times a day (QID) | ORAL | 0 refills | Status: DC | PRN

## 2018-01-05 MED ORDER — SILVER NITRATE-POT NITRATE 75-25 % EX MISC
CUTANEOUS | Status: AC
  Filled 2018-01-05: qty 1

## 2018-01-05 MED ORDER — SOD CITRATE-CITRIC ACID 500-334 MG/5ML PO SOLN
30.0000 mL | ORAL | Status: AC
  Administered 2018-01-05: 30 mL via ORAL

## 2018-01-05 MED ORDER — LACTATED RINGERS IV SOLN: INTRAVENOUS | Status: DC

## 2018-01-05 MED ORDER — DOCUSATE SODIUM 100 MG PO CAPS: 100.0000 mg | ORAL_CAPSULE | Freq: Two times a day (BID) | ORAL | 0 refills | Status: DC

## 2018-01-05 MED ORDER — KETOROLAC TROMETHAMINE 30 MG/ML IJ SOLN
INTRAMUSCULAR | Status: DC | PRN
  Administered 2018-01-05: 30 mg via INTRAVENOUS

## 2018-01-05 MED ORDER — FENTANYL CITRATE (PF) 100 MCG/2ML IJ SOLN
INTRAMUSCULAR | Status: AC
  Filled 2018-01-05: qty 2

## 2018-01-05 MED ORDER — LIDOCAINE 2% (20 MG/ML) 5 ML SYRINGE
INTRAMUSCULAR | Status: AC
  Filled 2018-01-05: qty 5

## 2018-01-05 MED ORDER — PHENYLEPHRINE HCL 10 MG/ML IJ SOLN
INTRAMUSCULAR | Status: DC | PRN
  Administered 2018-01-05 (×7): 120 ug via INTRAVENOUS

## 2018-01-05 MED ORDER — PROPOFOL 10 MG/ML IV BOLUS
INTRAVENOUS | Status: AC
  Filled 2018-01-05: qty 20

## 2018-01-05 MED ORDER — ONDANSETRON HCL 4 MG/2ML IJ SOLN
INTRAMUSCULAR | Status: DC | PRN
  Administered 2018-01-05: 4 mg via INTRAVENOUS

## 2018-01-05 MED ORDER — FENTANYL CITRATE (PF) 100 MCG/2ML IJ SOLN
50.0000 ug | INTRAMUSCULAR | Status: AC | PRN
  Administered 2018-01-05: 50 ug via INTRAVENOUS
  Administered 2018-01-05: 100 ug via INTRAVENOUS
  Administered 2018-01-05: 50 ug via INTRAVENOUS

## 2018-01-05 MED ORDER — DEXAMETHASONE SODIUM PHOSPHATE 10 MG/ML IJ SOLN
INTRAMUSCULAR | Status: AC
  Filled 2018-01-05: qty 1

## 2018-01-05 MED ORDER — PROPOFOL 10 MG/ML IV BOLUS
INTRAVENOUS | Status: DC | PRN
  Administered 2018-01-05: 100 mg via INTRAVENOUS
  Administered 2018-01-05: 300 mg via INTRAVENOUS
  Administered 2018-01-05 (×2): 100 mg via INTRAVENOUS

## 2018-01-05 MED ORDER — NORETHIN ACE-ETH ESTRAD-FE 1-20 MG-MCG(24) PO CAPS: 1.0000 | ORAL_CAPSULE | Freq: Every day | ORAL | 0 refills | Status: DC

## 2018-01-05 MED ORDER — PROMETHAZINE HCL 25 MG/ML IJ SOLN: 6.2500 mg | INTRAMUSCULAR | Status: DC | PRN

## 2018-01-05 MED ORDER — DEXAMETHASONE SODIUM PHOSPHATE 4 MG/ML IJ SOLN
INTRAMUSCULAR | Status: DC | PRN
  Administered 2018-01-05: 10 mg via INTRAVENOUS

## 2018-01-05 MED ORDER — SOD CITRATE-CITRIC ACID 500-334 MG/5ML PO SOLN
ORAL | Status: AC
  Filled 2018-01-05: qty 30

## 2018-01-05 MED ORDER — SUCCINYLCHOLINE CHLORIDE 20 MG/ML IJ SOLN
INTRAMUSCULAR | Status: DC | PRN
  Administered 2018-01-05: 30 mg via INTRAVENOUS

## 2018-01-05 MED ORDER — MIDAZOLAM HCL 2 MG/2ML IJ SOLN
1.0000 mg | INTRAMUSCULAR | Status: DC | PRN
  Administered 2018-01-05: 2 mg via INTRAVENOUS

## 2018-01-05 MED ORDER — LIDOCAINE HCL (CARDIAC) PF 100 MG/5ML IV SOSY
PREFILLED_SYRINGE | INTRAVENOUS | Status: DC | PRN
  Administered 2018-01-05: 100 mg via INTRAVENOUS

## 2018-01-05 MED ORDER — PROPOFOL 10 MG/ML IV BOLUS
INTRAVENOUS | Status: AC
  Filled 2018-01-05: qty 40

## 2018-01-05 MED ORDER — SODIUM CHLORIDE 0.9 % IR SOLN
Status: DC | PRN
  Administered 2018-01-05: 21000 mL

## 2018-01-05 MED ORDER — HYDROMORPHONE HCL 1 MG/ML IJ SOLN: 0.2500 mg | INTRAMUSCULAR | Status: DC | PRN

## 2018-01-05 MED ORDER — LACTATED RINGERS IV SOLN
INTRAVENOUS | Status: DC
  Administered 2018-01-05 (×2): via INTRAVENOUS

## 2018-01-05 MED ORDER — GLYCOPYRROLATE 0.2 MG/ML IJ SOLN
INTRAMUSCULAR | Status: DC | PRN
  Administered 2018-01-05: 0.2 mg via INTRAVENOUS

## 2018-01-05 MED ORDER — FERROUS SULFATE 325 (65 FE) MG PO TABS: 325.0000 mg | ORAL_TABLET | Freq: Two times a day (BID) | ORAL | 0 refills | Status: DC

## 2018-01-05 SURGICAL SUPPLY — 22 items
BRIEF STRETCH FOR OB PAD XXL (UNDERPADS AND DIAPERS) ×3 IMPLANT
CANISTER SUCT 3000ML PPV (MISCELLANEOUS) ×3 IMPLANT
CATH ROBINSON RED A/P 16FR (CATHETERS) ×3 IMPLANT
DEVICE MYOSURE REACH (MISCELLANEOUS) ×6 IMPLANT
GLOVE BIOGEL PI IND STRL 7.0 (GLOVE) ×1 IMPLANT
GLOVE BIOGEL PI IND STRL 7.5 (GLOVE) ×1 IMPLANT
GLOVE BIOGEL PI INDICATOR 7.0 (GLOVE) ×2
GLOVE BIOGEL PI INDICATOR 7.5 (GLOVE) ×2
GLOVE SURG SS PI 7.0 STRL IVOR (GLOVE) ×3 IMPLANT
GOWN STRL REUS W/TWL LRG LVL3 (GOWN DISPOSABLE) IMPLANT
GOWN STRL REUS W/TWL XL LVL3 (GOWN DISPOSABLE) ×3 IMPLANT
KIT PROCEDURE FLUENT (KITS) ×3 IMPLANT
MYOSURE XL FIBROID (MISCELLANEOUS) ×3
PACK VAGINAL MINOR WOMEN LF (CUSTOM PROCEDURE TRAY) ×3 IMPLANT
PAD OB MATERNITY 4.3X12.25 (PERSONAL CARE ITEMS) ×3 IMPLANT
PAD PREP 24X48 CUFFED NSTRL (MISCELLANEOUS) ×3 IMPLANT
SEAL ROD LENS SCOPE MYOSURE (ABLATOR) ×6 IMPLANT
SLEEVE SCD COMPRESS KNEE MED (MISCELLANEOUS) ×3 IMPLANT
SUT VIC AB 1 CT1 27 (SUTURE) ×4
SUT VIC AB 1 CT1 27XBRD ANBCTR (SUTURE) ×2 IMPLANT
SYSTEM TISS REMOVAL MYOSURE XL (MISCELLANEOUS) ×1 IMPLANT
TOWEL GREEN STERILE FF (TOWEL DISPOSABLE) ×6 IMPLANT

## 2018-01-05 NOTE — Transfer of Care (Signed)
Immediate Anesthesia Transfer of Care Note  Patient: Becky Larson  Procedure(s) Performed: DILATATION AND CURETTAGE /HYSTEROSCOPY/MYOMECTOMY/POLYPECTOMY (N/A )  Patient Location: PACU  Anesthesia Type:General  Level of Consciousness: awake, alert  and oriented  Airway & Oxygen Therapy: Patient Spontanous Breathing and Patient connected to face mask oxygen  Post-op Assessment: Report given to RN and Post -op Vital signs reviewed and stable  Post vital signs: Reviewed and stable  Last Vitals:  Vitals Value Taken Time  BP 105/56 01/05/2018 11:05 AM  Temp    Pulse 106 01/05/2018 11:07 AM  Resp 26 01/05/2018 11:07 AM  SpO2 100 % 01/05/2018 11:07 AM  Vitals shown include unvalidated device data.  Last Pain:  Vitals:   01/05/18 0813  TempSrc: Oral         Complications: No apparent anesthesia complications

## 2018-01-05 NOTE — Progress Notes (Signed)
BMET and CBC results reviewed with Dr. Roanna Banning, critical HGB of 6.9, pt pale but asymptomatic otherwise, Okay to discharge home on bid FE and follow up in two weeks. Pt with small amount of bleeding on peri pad , instructed pt the importance of earlier follow up if bleeding is heavy and/or continuous.

## 2018-01-05 NOTE — Anesthesia Procedure Notes (Signed)
Procedure Name: Intubation Performed by: Verita Lamb, CRNA Pre-anesthesia Checklist: Patient identified, Emergency Drugs available, Suction available, Patient being monitored and Timeout performed Patient Re-evaluated:Patient Re-evaluated prior to induction Oxygen Delivery Method: Circle system utilized Preoxygenation: Pre-oxygenation with 100% oxygen Induction Type: IV induction and Rapid sequence Laryngoscope Size: Mac and 3 Grade View: Grade I Tube type: Oral Tube size: 7.0 mm Number of attempts: 1 Airway Equipment and Method: Stylet Placement Confirmation: ETT inserted through vocal cords under direct vision,  positive ETCO2,  CO2 detector and breath sounds checked- equal and bilateral Secured at: 21 cm Tube secured with: Tape Dental Injury: Teeth and Oropharynx as per pre-operative assessment

## 2018-01-05 NOTE — Op Note (Signed)
Operative Note   01/05/2018  PRE-OP DIAGNOSIS: Abnormal uterine bleeding. Intrauterine fibroid vs polyp. Anemia   POST-OP DIAGNOSIS: Same. Submucosal (type 1) fibroid   SURGEON: Surgeon(s) and Role:    * Dillon Livermore, Eduard Clos, MD - Primary  ASSISTANT: none  PROCEDURE: Hysteroscopy, Myosure myomectomy  ANESTHESIA: General and paracervical block  ESTIMATED BLOOD LOSS: 71mL  DRAINS: I/O foley pre op per anesthesia note   TOTAL IV FLUIDS: per anesthesia note  SPECIMENS: uterine fibroid  VTE PROPHYLAXIS: SCDs to the bilateral lower extremities  ANTIBIOTICS: not indicated  FLUID DEFICIT: 378HY  COMPLICATIONS: Large patulous cervix which caused difficulty maintaining intrauterine pressure. The large myosure blade available didn't fit the new equipment so the new, large myosure blade had to be retrieved from [employer], which caused an approximately 65m delay in the case.  DISPOSITION: PACU - hemodynamically stable.  CONDITION: stable  FINDINGS: Exam under anesthesia revealed mobile, 14-16wk uterus with no masses and bilateral adnexa without masses or fullness. Hysteroscopy revealed normal intracervical canal, large intracavitary fibroid (likely submucosal, type 1, approximately 6-7cm in size, large flat base on the posterior to left posterior aspect of the cavity, the fibroid started approximately 1cm past the internal os, the fibroid was moderately hard and very avascular), grossly normal atrophic appearing uterine cavity, normal tubal ostia bilaterally.  At the end of the procedure it appeared that the only part left of the fibroid was its large, flat base (?5% left of the fibroid). Normal EGBUS, vaginal vault (no vaginal bleeding), cervix.  PROCEDURE IN DETAIL:  After informed consent was obtained, the patient was taken to the operating room where anesthesia was obtained without difficulty. The patient was positioned in the dorsal lithotomy position in [place].  The patient's  bladder was catheterized with an in and out foley catheter.  The patient was examined under anesthesia, with the above noted findings.  The bi-valved speculum was placed inside the patient's vagina, and the the anterior lip of the cervix was seen and grasped with the tenaculum.  A paracervical block was achieved with 73mL 1% lidocaine.  The uterine cavity was sounded to 9cm but this is likely where the fibroid blocked any further passage. The cervix was already dilated to pass a 21 French-Pratt dilator.  The hysteroscope was introduced, with the above noted findings. During this time, three single toothed tenaculum had to be applied (two anteriorly and one posteriorly) to help maintain fluid pressure and even then the scope had to nearly be hubbed to the external os. While awaiting the larger blade, the regular myosure was used and about half was removed; two regular myosures were used due to dulling of the blade. With the larger blade, the remainder of the fibroid was removed. Thankfully, the fibroid wasn't vascular so the case was able to be complicated with minimal fluid deficit and obscuring of the operative field.   The hysteroscope was removed and silver nitrate and monsels placed at the former tenaculum sites. Excellent hemostasis was noted, and all instruments were removed, with excellent hemostasis noted throughout.  She was then taken out of dorsal lithotomy. The patient tolerated the procedure well.  Sponge, lap and instrument counts were correct x2.  The patient was taken to recovery room in excellent condition.  Durene Romans MD Attending Center for [employer])

## 2018-01-05 NOTE — Anesthesia Preprocedure Evaluation (Addendum)
Anesthesia Evaluation  Patient identified by MRN, date of birth, ID band Patient awake    Reviewed: Allergy & Precautions, NPO status , Patient's Chart, lab work & pertinent test results  Airway Mallampati: II  TM Distance: >3 FB Neck ROM: Full    Dental no notable dental hx.    Pulmonary neg pulmonary ROS,    Pulmonary exam normal breath sounds clear to auscultation       Cardiovascular negative cardio ROS Normal cardiovascular exam Rhythm:Regular Rate:Normal     Neuro/Psych negative neurological ROS  negative psych ROS   GI/Hepatic negative GI ROS, (+)     substance abuse  ,   Endo/Other  negative endocrine ROS  Renal/GU negative Renal ROS     Musculoskeletal negative musculoskeletal ROS (+)   Abdominal   Peds  Hematology  (+) anemia ,   Anesthesia Other Findings Polyp  Reproductive/Obstetrics hcg negative                            Anesthesia Physical Anesthesia Plan  ASA: II  Anesthesia Plan: General   Post-op Pain Management:    Induction: Intravenous  PONV Risk Score and Plan: 4 or greater and Midazolam, Dexamethasone, Ondansetron and Treatment may vary due to age or medical condition  Airway Management Planned: Oral ETT  Additional Equipment:   Intra-op Plan:   Post-operative Plan: Extubation in OR  Informed Consent: I have reviewed the patients History and Physical, chart, labs and discussed the procedure including the risks, benefits and alternatives for the proposed anesthesia with the patient or authorized representative who has indicated his/her understanding and acceptance.   Dental advisory given  Plan Discussed with: CRNA  Anesthesia Plan Comments:        Anesthesia Quick Evaluation

## 2018-01-05 NOTE — Progress Notes (Signed)
Pt forgot underwear upon discharge,spoke with husband who states to throw underwear away.

## 2018-01-05 NOTE — Discharge Instructions (Addendum)
No Ibuprofen products until after 5pm today.   Post Anesthesia Home Care Instructions  Activity: Get plenty of rest for the remainder of the day. A responsible individual must stay with you for 24 hours following the procedure.  For the next 24 hours, DO NOT: -Drive a car -Paediatric nurse -Drink alcoholic beverages -Take any medication unless instructed by your physician -Make any legal decisions or sign important papers.  Meals: Start with liquid foods such as gelatin or soup. Progress to regular foods as tolerated. Avoid greasy, spicy, heavy foods. If nausea and/or vomiting occur, drink only clear liquids until the nausea and/or vomiting subsides. Call your physician if vomiting continues.  Special Instructions/Symptoms: Your throat may feel dry or sore from the anesthesia or the breathing tube placed in your throat during surgery. If this causes discomfort, gargle with warm salt water. The discomfort should disappear within 24 hours.  If you had a scopolamine patch placed behind your ear for the management of post- operative nausea and/or vomiting:  1. The medication in the patch is effective for 72 hours, after which it should be removed.  Wrap patch in a tissue and discard in the trash. Wash hands thoroughly with soap and water. 2. You may remove the patch earlier than 72 hours if you experience unpleasant side effects which may include dry mouth, dizziness or visual disturbances. 3. Avoid touching the patch. Wash your hands with soap and water after contact with the patch.   Start the birth control pills again and take them until seen in clinic for your after surgery visit Take a month of twice daily iron and colace     We will discuss your surgery once again in detail at your post-op visit in two to four weeks. If you havent already done so, please call to make your appointment as soon as possible.  Dilation and Curettage or Vacuum Curettage, Care After These instructions  give you information on caring for yourself after your procedure. Your doctor may also give you more specific instructions. Call your doctor if you have any problems or questions after your procedure. HOME CARE  Do not drive for 24 hours.  Wait 1 week before doing any activities that wear you out.  Do not stand for a long time.  Limit stair climbing to once or twice a day.  Rest often.  Continue with your usual diet.  Drink enough fluids to keep your pee (urine) clear or pale yellow.  If you have a hard time pooping (constipation), you may:  Take a medicine to help you go poop (laxative) as told by your doctor.  Eat more fruit and bran.  Drink more fluids.  Take showers, not baths, for as long as told by your doctor.  Do not swim or use a hot tub until your doctor says it is okay.  Have someone with you for 1day after the procedure.  Do not douche, use tampons, or have sex (intercourse) until seen by your doctor  Only take medicines as told by your doctor. Do not take aspirin. It can cause bleeding.  Keep all doctor visits. GET HELP IF:  You have cramps or pain not helped by medicine.  You have new pain in the belly (abdomen).  You have a bad smelling fluid coming from your vagina.  You have a rash.  You have problems with any medicine. GET HELP RIGHT AWAY IF:   You start to bleed more than a regular period.  You have a  fever.  You have chest pain.  You have trouble breathing.  You feel dizzy or feel like passing out (fainting).  You pass out.  You have pain in the tops of your shoulders.  You have vaginal bleeding with or without clumps of blood (blood clots). MAKE SURE YOU:  Understand these instructions.  Will watch your condition.  Will get help right away if you are not doing well or get worse. Document Released: 12/17/2007 Document Revised: 03/14/2013 Document Reviewed: 10/06/2012 [facility] Patient Information 2015 [place], [place]. This  information is not intended to replace advice given to you by your health care provider. Make sure you discuss any questions you have with your health care provider.

## 2018-01-05 NOTE — Progress Notes (Signed)
Lab results reviewed by Dr. Roanna Banning, will proceed with surgery as scheduled.

## 2018-01-05 NOTE — H&P (Signed)
Obstetrics and Gynecology Pre Op H&P  Surgery Date: 11/25/2017  OBGYN Clinic: [facility]  Primary Care Provider: Patient, No Pcp Per  Referring Provider: [facility]  Chief Complaint: AUB   History of Present Illness: Becky Larson is a 37 y.o. African-American [phone] (Patient's last menstrual period unknown due to AUB), seen for the above chief complaint.   Patient seen by my on 9/5 for ED f/u for AUB, heavy bleeding, which she received IV meds and 1U PRBC. She had a embx with me that day showed benign polyp and pap with NILM/HPV pos but negative 16/18. Pt put on OCPs and d/w her re: options and she wanted to preserve fertility so hysteroscopy, D&C scheduled. She stopped the OCPs a week ago b/c she felt it didn't help with the bleeding, which persisted. No s/s of anemia and she doesn't believe she has any bleeding today.    Patient went to [facility] ED on 8/24 for heavy vaginal bleeding. Patient received IV premarin, megace and 1U PRBC and was discharged to home with Lysteda. Pt states she only took the Lysteda for two days b/c she thought it actually made the bleeding worse. No bleeding since d/c from ED and no s/s of anemia, chest pain, sob, pain  Review of Systems: as noted in the History of Present Illness.  Past Medical History:      Past Medical History:  Diagnosis Date  . Ectopic pregnancy    Right    Past Surgical History:       Past Surgical History:  Procedure Laterality Date  . LAPAROSCOPIC SALPINGOOPHERECTOMY Right    ectopic    Past Obstetrical History:                  OB History  Gravida Para Term Preterm AB Living  7 2 2  0 5 2  SAB TAB Ectopic Multiple Live Births     3 2 0 0 2       # Outcome Date GA Lbr Len/2nd Weight Sex Delivery Anes PTL Lv  7 Term           6 Term           5 SAB           4 SAB           3 SAB           2 TAB           1 TAB              Past Gynecological History: As per HPI. Periods: prior to most recent episode, regular, not that heavy or painful History of Pap Smear(s): Yes.   Last pap 2019: NILM/HPV pos with neg HPV 16/18/45 She is currently using nothing for contraception.   Social History:  Social History        Socioeconomic History  . Marital status: Married    Spouse name: Not on file  . Number of children: Not on file  . Years of education: Not on file  . Highest education level: Not on file  Occupational History  . Not on file  Social Needs  . Financial resource strain: Not on file  . Food insecurity:    Worry: Not on file    Inability: Not on file  . Transportation needs:    Medical: Not on file    Non-medical: Not on file  Tobacco Use  . Smoking status: Never Smoker  .  Smokeless tobacco: Never Used  Substance and Sexual Activity  . Alcohol use: Yes    Comment: socially  . Drug use: No  . Sexual activity: Yes    Birth control/protection: Surgical  Lifestyle  . Physical activity:    Days per week: Not on file    Minutes per session: Not on file  . Stress: Not on file  Relationships  . Social connections:    Talks on phone: Not on file    Gets together: Not on file    Attends religious service: Not on file    Active member of club or organization: Not on file    Attends meetings of clubs or organizations: Not on file    Relationship status: Not on file  . Intimate partner violence:    Fear of current or ex partner: Not on file    Emotionally abused: Not on file    Physically abused: Not on file    Forced sexual activity: Not on file  Other Topics Concern  . Not on file  Social History Narrative  . Not on file    Family History: History reviewed. No pertinent family history. She denies any female cancers, bleeding or blood clotting disorders.    Medications [name]. [name] had no medications administered during this  visit.       Current Outpatient Medications  Medication Sig Dispense Refill  . ferrous sulfate 325 (65 FE) MG tablet Take 1 tablet (325 mg total) by mouth daily. 30 tablet 1  . docusate sodium (COLACE) 100 MG capsule Take 1 capsule (100 mg total) by mouth 2 (two) times daily. While on iron (Patient not taking: Reported on 11/25/2017) 60 capsule 1   No current facility-administered medications for this visit.     Allergies Morphine and related   Physical Exam:  VS pending General appearance: Well nourished, well developed female in no acute distress.  Neck:  Supple, normal appearance, and no thyromegaly  Cardiovascular: normal s1 and s2.  No murmurs, rubs or gallops. Respiratory:  Clear to auscultation bilateral. Normal respiratory effort Abdomen: positive bowel sounds and no masses, hernias; diffusely non tender to palpation, non distended Neuro/Psych:  Normal mood and affect.  Skin:  Warm and dry.   From 9/5:  Lymphatic:  No inguinal lymphadenopathy.   Pelvic exam: is not limited by body habitus EGBUS: within normal limits, Vagina: within normal limits and with minimal blood or discharge in the vault, Cervix: normal appearing cervix without tenderness, discharge or lesions. Uterus:  enlarged, c/w 12-14 week size and non tender, mobile and Adnexa:  normal adnexa and no mass, fullness, tenderness Rectovaginal: deferred  See procedure note for EMBx. Patient very intolerant of exam.  From embx: The patient was placed in the dorsal lithotomy position.  Bimanual exam showed the uterus to be in the anteroflexed position.  A Graves' speculum inserted in the vagina, and the cervix was visualized and a pap smear performed. The cervix was then prepped with povidone iodine, and a sharp tenaculum was applied to the anterior lip of the cervix for stabilization.  A pipelle was inserted into the uterine cavity and sounded the uterus to a depth of 9cm.  A Small-moderate amount of tissue and  blood was collected after 2 passes.   Laboratory: UPT neg CBC Latest Ref Rng & Units 01/04/2018 11/13/2017 11/13/2017  WBC 4.0 - 10.5 K/uL 8.1 9.1 9.4  Hemoglobin 12.0 - 15.0 g/dL 8.6(L) 7.1(L) 7.7(L)  Hematocrit 36.0 - 46.0 %  30.5(L) 23.8(L) 26.3(L)  Platelets 150 - 400 K/uL 488(H) 360 382    Radiology:  CLINICAL DATA: Abnormal bleeding. Evaluate for fibroids  EXAM: TRANSABDOMINAL ULTRASOUND OF PELVIS  TECHNIQUE: Transabdominal ultrasound examination of the pelvis was performed including evaluation of the uterus, ovaries, adnexal regions, and pelvic cul-de-sac.  COMPARISON: None.  FINDINGS: Uterus  Measurements: 15.8 x 9.3 x 9.7 cm. Heterogeneous echotexture. Focal intramural fundal fibroid anteriorly measures up to 2.6 cm.  Endometrium  Thickness: Markedly thickened, measuring up to 6.4 cm in thickness. Heterogeneous mass noted within the endometrium measures 8.2 x 7.1 x 5.8 cm. Internal blood flow noted.  Right ovary  Measurements: Not visualized. No adnexal mass seen.  Left ovary  Measurements: 4.6 x 2.4 x 2.5 cm. Normal appearance/no adnexal mass.  Other findings: No abnormal free fluid.  IMPRESSION: Enlarged, heterogeneous myometrium. Focal fundal fibroid measures 2.6 cm.  Large heterogeneous endometrial mass measuring up to 8.2 cm with internal blood flow. Cannot exclude endometrial cancer. Recommend tissue sampling.   Electronically Signed By: Rolm Baptise M.D. On: 11/13/2017 18:08  Assessment: pt stable  Plan:  1. Abnormal uterine bleeding D/w her re: procedure and hope to remove all of polyp or fibroid and not obscured with bleeding or it's own size. Can proceed when OR is ready. Will start with Myosure and have endoloop on hand.  Durene Romans MD Attending Center for [employer])

## 2018-01-05 NOTE — Anesthesia Postprocedure Evaluation (Signed)
Anesthesia Post Note  Patient: Becky Larson  Procedure(s) Performed: DILATATION AND CURETTAGE /HYSTEROSCOPY/MYOMECTOMY/POLYPECTOMY (N/A )     Patient location during evaluation: PACU Anesthesia Type: General Level of consciousness: awake and alert Pain management: pain level controlled Vital Signs Assessment: post-procedure vital signs reviewed and stable Respiratory status: spontaneous breathing, nonlabored ventilation, respiratory function stable and patient connected to nasal cannula oxygen Cardiovascular status: blood pressure returned to baseline and stable Postop Assessment: no apparent nausea or vomiting Anesthetic complications: no    Last Vitals:  Vitals:   01/05/18 1130 01/05/18 1156  BP: (!) 105/54 119/76  Pulse: (!) 110 (!) 103  Resp: 19 20  Temp:  36.6 C  SpO2: 99% 99%    Last Pain:  Vitals:   01/05/18 1156  TempSrc:   PainSc: 0-No pain                 Ryan P Ellender

## 2018-01-06 ENCOUNTER — Encounter (HOSPITAL_BASED_OUTPATIENT_CLINIC_OR_DEPARTMENT_OTHER): Payer: Self-pay | Admitting: Obstetrics and Gynecology

## 2018-01-06 DIAGNOSIS — D259 Leiomyoma of uterus, unspecified: Secondary | ICD-10-CM | POA: Diagnosis not present

## 2018-01-06 DIAGNOSIS — N938 Other specified abnormal uterine and vaginal bleeding: Secondary | ICD-10-CM | POA: Diagnosis not present

## 2018-01-11 ENCOUNTER — Telehealth: Payer: Self-pay | Admitting: General Practice

## 2018-01-11 NOTE — Telephone Encounter (Signed)
Patient called and left message on nurse voicemail line stating she had a d&c 10/16 and thinks she may be having complications. Patient states she is having increased pain & bleeding.  Called patient and she states she started to have an increase in cramping & bleeding after "fooling around" with her husband. Patient states nothing was inserted but they were "fooling around & she did orgasm." Patient states the bleeding isn't heavy, just more than what it was. Discussed with patient the hormones that the body releases during sexual encounters can also make the uterus contract which would increase her cramping/bleeding. Advised she abstain from sexual encounters for now & monitor symptoms. Discussed coming in if things progress or get worse. Patient verbalized understanding & had no questions.

## 2018-01-23 ENCOUNTER — Emergency Department (HOSPITAL_COMMUNITY)
Admission: EM | Admit: 2018-01-23 | Discharge: 2018-01-24 | Discharge status: Against Medical Advice | Payer: Medicaid Other

## 2018-01-23 ENCOUNTER — Other Ambulatory Visit: Payer: Self-pay

## 2018-01-23 ENCOUNTER — Encounter (HOSPITAL_COMMUNITY): Payer: Self-pay

## 2018-01-23 NOTE — ED Notes (Signed)
Pt called for a room, Pt not in lobby, registration stated pt had left.

## 2018-01-23 NOTE — ED Triage Notes (Signed)
Pt c/o increased vaginal bleeding following a D & C on 10/16.  Pt reports she is soaking threw pads.  Pt reports the MD that did the procedure told her to come in, if she started soaking through pads.  Pain score 7/10.  Pt reports taking advil w/ relief.

## 2018-01-24 ENCOUNTER — Telehealth: Payer: Self-pay | Admitting: *Deleted

## 2018-01-24 NOTE — Telephone Encounter (Signed)
Becky Larson left a message this am she still hasn't stopped bleeding since her D&C and is bleeding a lot. States she is soaking an overnight pad every hour with clots. I called [name] and she states since her D&C 10/16 her bleeding did get light and then on Friday it came back heavy and she is changing her pad mostly every hour , sometimes every 1-2. Denies dizzyness, lightheadedness. Her gyn app not until next week. I advised her to go to MAU for evaluation. She voices understanding.

## 2018-02-02 ENCOUNTER — Ambulatory Visit (INDEPENDENT_AMBULATORY_CARE_PROVIDER_SITE_OTHER): Payer: Medicaid Other | Admitting: Obstetrics and Gynecology

## 2018-02-02 ENCOUNTER — Encounter: Payer: Self-pay | Admitting: Obstetrics and Gynecology

## 2018-02-02 VITALS — BP 106/58 | HR 74 | Wt 168.2 lb

## 2018-02-02 DIAGNOSIS — B9689 Other specified bacterial agents as the cause of diseases classified elsewhere: Secondary | ICD-10-CM | POA: Diagnosis not present

## 2018-02-02 DIAGNOSIS — Z9889 Other specified postprocedural states: Secondary | ICD-10-CM

## 2018-02-02 DIAGNOSIS — Z09 Encounter for follow-up examination after completed treatment for conditions other than malignant neoplasm: Secondary | ICD-10-CM

## 2018-02-02 DIAGNOSIS — N939 Abnormal uterine and vaginal bleeding, unspecified: Secondary | ICD-10-CM | POA: Diagnosis not present

## 2018-02-02 DIAGNOSIS — N760 Acute vaginitis: Secondary | ICD-10-CM | POA: Diagnosis not present

## 2018-02-02 MED ORDER — FLUCONAZOLE 150 MG PO TABS: 150.0000 mg | ORAL_TABLET | Freq: Once | ORAL | 0 refills | Status: AC

## 2018-02-02 MED ORDER — METRONIDAZOLE 500 MG PO TABS: 500.0000 mg | ORAL_TABLET | Freq: Two times a day (BID) | ORAL | 0 refills | Status: AC

## 2018-02-02 NOTE — Progress Notes (Signed)
Center for [facility] 02/02/2018  CC: regular post op visit  Subjective:   S/p 10/16 hysterscopy, myosure myomectomy of type 1 large submucosal fibroid for AUB; she was discharged from the PACU.   Patient has had bleeding since the procedure but it is very light and not painful. She believes she may have had a period approx 2 weeks ago and she has scant bleeding currently and still no pain.   Objective:    BP (!) 106/58   Pulse 74   Wt 168 lb 3.2 oz (76.3 kg)   LMP  (LMP Unknown)   BMI 27.15 kg/m   NAD Abdomen: soft, nttp, ND EGBUS: normal Vaginal vault: scant, old blood in vault, no active bleeding, +smell Cervix: negative Bimanual: no CMT, no uterine tenderness, uterus mobile, feels smaller at 12-14wks and slightly deviated to the right.  Assessment:  Patient continuing to improve   Plan:  Diflucan and flagyl for BV.  D/w her that it's encouraging that the bleeding is much improved and she's less than a month out from the surgery. Will see her back in another month to see how she's doing. If bleeding worsens, patient to let us know. She still desires to preserve fertility. She states that she usually has periods around the 21st before she starting having the AUB, anemia issues.  Durene Romans MD Attending Center for [employer] (Faculty Practice) 02/02/2018

## 2018-03-17 ENCOUNTER — Encounter: Payer: Self-pay | Admitting: Obstetrics and Gynecology

## 2018-03-17 ENCOUNTER — Ambulatory Visit (INDEPENDENT_AMBULATORY_CARE_PROVIDER_SITE_OTHER): Payer: Medicaid Other | Admitting: Obstetrics and Gynecology

## 2018-03-17 VITALS — BP 107/63 | HR 80 | Ht 66.0 in | Wt 163.6 lb

## 2018-03-17 DIAGNOSIS — D500 Iron deficiency anemia secondary to blood loss (chronic): Secondary | ICD-10-CM

## 2018-03-17 DIAGNOSIS — Z862 Personal history of diseases of the blood and blood-forming organs and certain disorders involving the immune mechanism: Secondary | ICD-10-CM

## 2018-03-17 DIAGNOSIS — N920 Excessive and frequent menstruation with regular cycle: Secondary | ICD-10-CM

## 2018-03-17 DIAGNOSIS — Z3189 Encounter for other procreative management: Secondary | ICD-10-CM | POA: Diagnosis not present

## 2018-03-17 MED ORDER — TRANEXAMIC ACID 650 MG PO TABS: 1300.0000 mg | ORAL_TABLET | Freq: Three times a day (TID) | ORAL | 1 refills | Status: AC

## 2018-03-17 NOTE — Patient Instructions (Signed)
Start the folic acid 1mg  by mouth daily  Have sex on cycle day #10 and have sex every other day for a week. Cycle day #1 is the first day of regular flow of your period  Download a fertility tracker for your phone.    If you don't get pregnant by April 2020 call us for a an appointment to evaluate.

## 2018-03-17 NOTE — Progress Notes (Signed)
Pt states when she was on her last cycle she bleed for 8 days & 4 of those days was heavy.

## 2018-03-17 NOTE — Progress Notes (Signed)
Obstetrics and Gynecology Visit Return Patient Evaluation  Appointment Date: 03/17/2018  Primary Care Provider: Patient, No Pcp Per  OBGYN Clinic: [facility] for [facility] [place]  Chief Complaint: menorrhagia  History of Present Illness:  Becky Larson is a 37 y.o. G7P2 (LMP: 12/17) with the above CC.   Patient states she had an 8 day long period that was heavy intially and then tapered off. She had a similar period that was a little worse back in mid November. Currently no bleeding or pain.  Patient wondering about trying for pregnancy and if bleeding is normal. She states it is much better than before her surgery back in mid October   Review of Systems:  as noted in the History of Present Illness. Medications:  Becky Larson had no medications administered during this visit. Current Outpatient Medications  Medication Sig Dispense Refill  . ferrous sulfate 325 (65 FE) MG tablet Take 1 tablet (325 mg total) by mouth 2 (two) times daily with a meal. 60 tablet 0  . docusate sodium (COLACE) 100 MG capsule Take 1 capsule (100 mg total) by mouth 2 (two) times daily. While on iron (Patient not taking: Reported on 02/02/2018) 60 capsule 0   No current facility-administered medications for this visit.     Allergies: is allergic to morphine and related.  Physical Exam:  BP 107/63   Pulse 80   Ht 5\' 6"  (1.676 m)   Wt 163 lb 9.6 oz (74.2 kg)   LMP 03/08/2018 (Exact Date)   BMI 26.41 kg/m  Body mass index is 26.41 kg/m. General appearance: Well nourished, well developed female in no acute distress.  Abdomen: diffusely non tender to palpation, non distended, and no masses, hernias Neuro/Psych:  Normal mood and affect.     Assessment: pt doing well  Plan:  1. Iron deficiency anemia due to chronic blood loss Pre op h/h 6.9 and 24.9. pt states on bid iron. Will recheck cbc to see.  - CBC  2. Menorrhagia I d/w her that it's good that her periods seem to be improving and I  recommend that she just keep an eye on it to see how it progress. Since she is trying for pregnancy I told her that there aren't any options available that could help with periods except for Lysteda. I gave her an Rx and I told her to use it if her periods get worse and heavy. R/b d/w her.   3. Fertility Patient to start folic acid and timed intercourse explained to her. I told her if no pregnancy in 70m to call us for work up and referral to [facility] since [facility].   RTC: PRN  Durene Romans MD Attending Center for [facility])

## 2018-03-18 ENCOUNTER — Telehealth: Payer: Self-pay | Admitting: *Deleted

## 2018-03-18 ENCOUNTER — Encounter: Payer: Self-pay | Admitting: Obstetrics and Gynecology

## 2018-03-18 DIAGNOSIS — D649 Anemia, unspecified: Secondary | ICD-10-CM

## 2018-03-18 DIAGNOSIS — N920 Excessive and frequent menstruation with regular cycle: Secondary | ICD-10-CM | POA: Insufficient documentation

## 2018-03-18 DIAGNOSIS — D62 Acute posthemorrhagic anemia: Secondary | ICD-10-CM | POA: Insufficient documentation

## 2018-03-18 LAB — CBC
Hematocrit: 21.2 % — ABNORMAL LOW (ref 34.0–46.6)
Hemoglobin: 6.1 g/dL — CL (ref 11.1–15.9)
MCH: 19.6 pg — ABNORMAL LOW (ref 26.6–33.0)
MCHC: 28.8 g/dL — ABNORMAL LOW (ref 31.5–35.7)
MCV: 68 fL — ABNORMAL LOW (ref 79–97)
PLATELETS: 473 10*3/uL — AB (ref 150–450)
RBC: 3.11 x10E6/uL — ABNORMAL LOW (ref 3.77–5.28)
RDW: 17 % — AB (ref 12.3–15.4)
WBC: 5.9 10*3/uL (ref 3.4–10.8)

## 2018-03-18 NOTE — Telephone Encounter (Signed)
-----   Message from Aletha Halim, MD sent at 03/18/2018  8:24 AM EST ----- Can you have [facility] add on a b12, folate, ferritin level? thanks

## 2018-03-18 NOTE — Telephone Encounter (Signed)
Unable to add b12, folate, and ferritin level to previous lab draw.  Called pt to request she come in for another blood draw to check these levels.  Pt verbalized understanding and reports that she is unsure if she can come in today but can definitely come in on Monday 03/21/18.  Orders placed for future lab draw.

## 2018-04-14 ENCOUNTER — Ambulatory Visit (INDEPENDENT_AMBULATORY_CARE_PROVIDER_SITE_OTHER): Payer: Medicaid Other | Admitting: Family Medicine

## 2018-04-14 ENCOUNTER — Encounter: Payer: Self-pay | Admitting: Family Medicine

## 2018-04-14 VITALS — BP 119/42 | HR 83

## 2018-04-14 DIAGNOSIS — N920 Excessive and frequent menstruation with regular cycle: Secondary | ICD-10-CM

## 2018-04-14 DIAGNOSIS — Z3042 Encounter for surveillance of injectable contraceptive: Secondary | ICD-10-CM

## 2018-04-14 DIAGNOSIS — Z789 Other specified health status: Secondary | ICD-10-CM

## 2018-04-14 LAB — POCT PREGNANCY, URINE: Preg Test, Ur: NEGATIVE

## 2018-04-14 MED ORDER — MEDROXYPROGESTERONE ACETATE 150 MG/ML IM SUSP
150.0000 mg | Freq: Once | INTRAMUSCULAR | Status: AC
  Administered 2018-04-14: 150 mg via INTRAMUSCULAR

## 2018-04-14 NOTE — Progress Notes (Signed)
   GYNECOLOGY OFFICE VISIT NOTE History:  38 y.o. [id number] here today for depo to help with heavy menstrual periods. Last LMP 04/08/18, had unprotected sex 03/30/18, pregnancy test negative. Still on period today. Using largest pads, changing every couple of hours, still with leakage overnight. Tried megace then stopped. She denies any abnormal vaginal discharge, bleeding, pelvic pain or other concerns.   The following portions of the patient's history were reviewed and updated as appropriate: allergies, current medications, past family history, past medical history, past social history, past surgical history and problem list.   Health Maintenance:  Normal pap 12/10/18 with (+) HPV but negative HR HPV (16/18/45)  Review of Systems:  Pertinent items noted in HPI Review of Systems  Constitutional: Negative for malaise/fatigue.  Gastrointestinal: Negative for abdominal pain.  Genitourinary:       Menstrual problem  Neurological: Negative for dizziness and headaches.    Objective:  Physical Exam BP (!) 119/42   Pulse 83   LMP 04/08/2018 (Exact Date)  Physical Exam  Constitutional: She is oriented to person, place, and time. She appears well-developed and well-nourished. No distress.  HENT:  Head: Normocephalic and atraumatic.  Eyes: Conjunctivae and EOM are normal.  Cardiovascular: Normal rate.  Pulmonary/Chest: No respiratory distress.  Neurological: She is alert and oriented to person, place, and time.  Psychiatric: She has a normal mood and affect. Her behavior is normal.  Nursing note and vitals reviewed.  Labs and Imaging Results for orders placed or performed in visit on 04/14/18 (from the past 168 hour(s))  Pregnancy, urine POC   Collection Time: 04/14/18  6:19 PM  Result Value Ref Range   Preg Test, Ur NEGATIVE NEGATIVE   No results found.  Assessment & Plan:   [id number] who presents for depo shot for heavy periods. Initially seen by Dr. Ilda Basset for AUB, heavy bleeding  and has had D&C, blood transfusion, OCPs, lysteda, premarin and inadequate trial of megace. Interested in [place] on risks/benefits of depo including irregular bleeding, weight gain related to increased appetite. She voiced understanding and still preferred to go with trial of depo. Given today in office, instructed to return in about 3 months for next injection.   Lambert Mody. Juleen China, DO OB Family Medicine Fellow, [facility] for [employer], [place]

## 2018-04-14 NOTE — Addendum Note (Signed)
Addended by: Michel Harrow on: 04/14/2018 07:48 PM   Modules accepted: Orders

## 2018-06-20 ENCOUNTER — Telehealth: Payer: Medicaid Other | Admitting: Nurse Practitioner

## 2018-06-20 DIAGNOSIS — N921 Excessive and frequent menstruation with irregular cycle: Secondary | ICD-10-CM

## 2018-06-20 NOTE — Progress Notes (Signed)
Based on what you shared with me it looks like you have excessive vaginal bleeding,that should be evaluated in a face to face office visit.  Heavy menses is not something that we deal with in and e visit. You will need to contact your Ob/GYN office.  * I called patient and she stated that she got a depo shot on January 23 and she has been having bleeding every since she got shot. She started out spotting and light bleeding and since March 2nd she has had heavy menses like a heavy period. She has not spoken with her ob/gyn since she got shot on Janauary 23.    NOTE: If you entered your credit card information for this eVisit, you will not be charged. You may see a "hold" on your card for the $30 but that hold will drop off and you will not have a charge processed.  If you are having a true medical emergency please call 911.  If you need an urgent face to face visit, [facility] has four urgent care centers for your convenience.  If you need care fast and have a high deductible or no insurance consider:   [email or web address] to reserve your spot online an avoid wait times  [facility] [address] [place], [place] [address] 8 am to 8 pm Monday-Friday 10 am to 4 pm Saturday-Sunday *Across the street from [employer]  [place], [address] 8 am to 5 pm Monday-Friday * In the [facility] on the [facility]   The following sites will take your  insurance:  . [facility] [place] a Provider at this Location  [address] [place], [place] [address] . 10 am to 8 pm Monday-Friday . 12 pm to 8 pm Saturday-Sunday   . [facility] at [place] a Provider at this Location  [address] [place], [place], [place] [address] . 8 am to 8 pm Monday-Friday . 9 am to 6 pm Saturday . 11 am  to 6 pm Sunday   . [facility] at [place] Get Driving Directions  [address], [place] [address] . 8 am to 8 pm Monday-Friday . 8 am to 4 pm Saturday-Sunday   Your e-visit answers were reviewed by a board certified advanced clinical practitioner to complete your personal care plan.

## 2018-06-30 ENCOUNTER — Telehealth: Payer: Self-pay | Admitting: Family Medicine

## 2018-06-30 NOTE — Telephone Encounter (Signed)
Attempted to call patient to let her know the office's new address. No answer, left voicemail with the new address.

## 2018-07-04 ENCOUNTER — Ambulatory Visit: Payer: Medicaid Other

## 2018-11-10 ENCOUNTER — Telehealth: Payer: Self-pay | Admitting: Obstetrics and Gynecology

## 2018-11-10 NOTE — Telephone Encounter (Signed)
Patient aware of appointment, instructed to wear a face mask and due to COVID no visitors are allowed.

## 2018-11-11 ENCOUNTER — Encounter: Payer: Self-pay | Admitting: Obstetrics and Gynecology

## 2018-11-11 ENCOUNTER — Other Ambulatory Visit (HOSPITAL_COMMUNITY)
Admission: RE | Admit: 2018-11-11 | Discharge: 2018-11-11 | Disposition: A | Discharge status: Home / Self Care | Payer: Medicaid Other | Source: Ambulatory Visit | Attending: Obstetrics and Gynecology | Admitting: Obstetrics and Gynecology

## 2018-11-11 ENCOUNTER — Ambulatory Visit (INDEPENDENT_AMBULATORY_CARE_PROVIDER_SITE_OTHER): Payer: Medicaid Other | Admitting: Obstetrics and Gynecology

## 2018-11-11 ENCOUNTER — Other Ambulatory Visit: Payer: Self-pay

## 2018-11-11 VITALS — BP 106/60 | HR 99 | Wt 165.9 lb

## 2018-11-11 DIAGNOSIS — B977 Papillomavirus as the cause of diseases classified elsewhere: Secondary | ICD-10-CM

## 2018-11-11 DIAGNOSIS — N898 Other specified noninflammatory disorders of vagina: Secondary | ICD-10-CM

## 2018-11-11 DIAGNOSIS — R1907 Generalized intra-abdominal and pelvic swelling, mass and lump: Secondary | ICD-10-CM

## 2018-11-11 DIAGNOSIS — N765 Ulceration of vagina: Secondary | ICD-10-CM | POA: Diagnosis not present

## 2018-11-11 NOTE — Progress Notes (Signed)
Abnormal spotting after periods ,  Extra discharge no smell or color/ clear white.  Periods are coming regular just still spotting continuously.

## 2018-11-11 NOTE — Progress Notes (Signed)
Obstetrics and Gynecology Visit Return Patient Evaluation  Appointment Date: 11/11/2018  Primary Care Provider: Patient, No Pcp Per  OBGYN Clinic: [facility] for [facility]  Chief Complaint: vag discharge, bleeding after sex  History of Present Illness:  Becky Larson is a 38 y.o. with above CC. Patient s/p 12/2017 hysteroscopy myosure myomectomy and states periods are regular, monthly, 7 days and getting lighter sine surgery and not painful. She only notes some bleeding after sex but no intermenstrual bleeding. She also notes vaginal discharge and would like sti swab testing.   No abdominal pain, nausea, vomiting  Review of Systems: as noted in the History of Present Illness.  Medications:  Mrs. Becky Larson had no medications administered during this visit. Current Outpatient Medications  Medication Sig Dispense Refill  . naproxen sodium (ALEVE) 220 MG tablet Take 220 mg by mouth.    . ferrous sulfate 325 (65 FE) MG tablet Take 1 tablet (325 mg total) by mouth 2 (two) times daily with a meal. (Patient not taking: Reported on 11/11/2018) 60 tablet 0   No current facility-administered medications for this visit.     Allergies: is allergic to morphine and related.  Physical Exam:  BP 106/60   Pulse 99   Wt 165 lb 14.4 oz (75.3 kg)   LMP 10/23/2018   BMI 26.78 kg/m  Body mass index is 26.78 kg/m. General appearance: Well nourished, well developed female in no acute distress.  Abdomen: diffusely non tender to palpation, non distended, and no masses, hernias Neuro/Psych:  Normal mood and affect.    Pelvic exam:  EGBUS: normal Vaginal vault: large mass fills vault, no cervix visible, from about 4cm from the introitus onward. Nttp, hard, appears c/w fibroid.  Procedure note 12 o'clock area injected with 1%lidocaine with epi and tischler forceps used to take biopsy. Area hemostatic. Silver nitrate applied   Assessment: pt stable  Plan:  1. HPV (human  papilloma virus) infection Unable to repeat pap today  2. Vaginal discharge Swab obtained  3. Generalized intra-abdominal and pelvic swelling, mass and lump Likely large fibroid. Will obtain mri and see patient back later to d/w her re: options.  - MR PELVIS W WO CONTRAST; Future - Basic metabolic panel - Surgical pathology( [place]/ [facility]) - Cervicovaginal ancillary only  4. Anemia Repeat cbc pre op  RTC: follow up after mri  Durene Romans MD Attending Center for [facility])

## 2018-11-12 LAB — BASIC METABOLIC PANEL
BUN/Creatinine Ratio: 15 (ref 9–23)
BUN: 9 mg/dL (ref 6–20)
CO2: 21 mmol/L (ref 20–29)
Calcium: 8.8 mg/dL (ref 8.7–10.2)
Chloride: 104 mmol/L (ref 96–106)
Creatinine, Ser: 0.62 mg/dL (ref 0.57–1.00)
GFR calc Af Amer: 133 mL/min/{1.73_m2} (ref >59)
GFR calc non Af Amer: 116 mL/min/{1.73_m2} (ref >59)
Glucose: 89 mg/dL (ref 65–99)
Potassium: 4 mmol/L (ref 3.5–5.2)
Sodium: 139 mmol/L (ref 134–144)

## 2018-11-15 LAB — CERVICOVAGINAL ANCILLARY ONLY
Bacterial vaginitis: NEGATIVE
Candida vaginitis: NEGATIVE
Chlamydia: NEGATIVE
Neisseria Gonorrhea: NEGATIVE
Trichomonas: NEGATIVE

## 2018-11-21 ENCOUNTER — Ambulatory Visit (HOSPITAL_COMMUNITY)
Admission: RE | Admit: 2018-11-21 | Discharge: 2018-11-21 | Disposition: A | Discharge status: Home / Self Care | Payer: Medicaid Other | Source: Ambulatory Visit | Attending: Obstetrics and Gynecology | Admitting: Obstetrics and Gynecology

## 2018-11-21 ENCOUNTER — Other Ambulatory Visit: Payer: Self-pay

## 2018-11-21 ENCOUNTER — Telehealth: Payer: Self-pay | Admitting: Obstetrics and Gynecology

## 2018-11-21 DIAGNOSIS — R1907 Generalized intra-abdominal and pelvic swelling, mass and lump: Secondary | ICD-10-CM

## 2018-11-21 NOTE — Telephone Encounter (Signed)
Patient said she couldn't get her MRI today said she need some medication in order to do it.

## 2018-11-21 NOTE — Telephone Encounter (Signed)
I called Becky Larson back and she reports she went to get the MRI but she couldn't get in the machine. States they told her she needs to take some " relaxing meds" to help her and to call her doctor. I informed her Dr. Ilda Basset is not in the office today, but I will send him a message asking what he would like to prescribe and we will get back to her. Also that we would reschedule her MRI and then call her back. She voices understanding. Linda,RN

## 2018-11-30 ENCOUNTER — Other Ambulatory Visit: Payer: Self-pay | Admitting: Obstetrics and Gynecology

## 2018-11-30 MED ORDER — LORAZEPAM 2 MG PO TABS: 2.0000 mg | ORAL_TABLET | Freq: Once | ORAL | 0 refills | Status: DC | PRN

## 2018-11-30 NOTE — Telephone Encounter (Signed)
Rescheduled MRI for 9/21 @ 5pm. Called and informed patient. Also discussed ativan sent to pharmacy & that she should take the medication 1 hour before the appt and she will need someone to take her home. Patient verbalized understanding & had no questions.

## 2018-11-30 NOTE — Telephone Encounter (Signed)
Can y'all let her know that I've sent in ativan to the pharmacy for her to take prior to the MRI and reschedule her MRI? she'll need a driver to take her home.   thanks

## 2018-12-12 ENCOUNTER — Other Ambulatory Visit: Payer: Self-pay

## 2018-12-12 ENCOUNTER — Ambulatory Visit (HOSPITAL_COMMUNITY)
Admission: RE | Admit: 2018-12-12 | Discharge: 2018-12-12 | Disposition: A | Discharge status: Home / Self Care | Payer: Medicaid Other | Source: Ambulatory Visit | Attending: Obstetrics and Gynecology | Admitting: Obstetrics and Gynecology

## 2018-12-12 DIAGNOSIS — R1907 Generalized intra-abdominal and pelvic swelling, mass and lump: Secondary | ICD-10-CM | POA: Diagnosis not present

## 2018-12-12 DIAGNOSIS — D251 Intramural leiomyoma of uterus: Secondary | ICD-10-CM | POA: Diagnosis not present

## 2018-12-12 MED ORDER — GADOBUTROL 1 MMOL/ML IV SOLN
7.0000 mL | Freq: Once | INTRAVENOUS | Status: AC | PRN
  Administered 2018-12-12: 7 mL via INTRAVENOUS

## 2018-12-13 ENCOUNTER — Other Ambulatory Visit (HOSPITAL_COMMUNITY): Payer: Self-pay | Admitting: Obstetrics and Gynecology

## 2018-12-13 ENCOUNTER — Ambulatory Visit (HOSPITAL_COMMUNITY)
Admission: RE | Admit: 2018-12-13 | Discharge: 2018-12-13 | Disposition: A | Discharge status: Home / Self Care | Payer: Medicaid Other | Source: Ambulatory Visit | Attending: Obstetrics and Gynecology | Admitting: Obstetrics and Gynecology

## 2018-12-13 DIAGNOSIS — R1907 Generalized intra-abdominal and pelvic swelling, mass and lump: Secondary | ICD-10-CM

## 2018-12-21 ENCOUNTER — Telehealth: Payer: Self-pay | Admitting: Obstetrics and Gynecology

## 2018-12-21 DIAGNOSIS — D219 Benign neoplasm of connective and other soft tissue, unspecified: Secondary | ICD-10-CM

## 2018-12-21 NOTE — Telephone Encounter (Signed)
GYN Telephone Note  Patient called about large fibroid seen on MRI.   CLINICAL DATA:  Intra-abdominal and pelvic swelling. Evaluate fibroids.  EXAM: MRI PELVIS WITHOUT AND WITH CONTRAST  TECHNIQUE: Multiplanar multisequence MR imaging of the pelvis was performed both before and after administration of intravenous contrast.  CONTRAST:  24mL GADAVIST GADOBUTROL 1 MMOL/ML IV SOLN  COMPARISON:  Ultrasound 11/13/2017  FINDINGS: Urinary Tract:  No abnormality visualized.  Bowel:  Unremarkable visualized pelvic bowel loops.  Vascular/Lymphatic: No pathologically enlarged lymph nodes. No significant vascular abnormality seen.  Reproductive:  Uterus: 15.7 x 8.3 by 8.6 cm (volume = 590 cm^3). Multiple uterine masses are identified.  The dominant mass arises posteriorly and extends into the into the endometrial cavity and with herniation into the cervix and dome of vagina. This measures 9.5 x 9.1 by 8.6 cm (volume = 390 cm^3).  Within the left anterior myometrium there is a 2.2 cm intramural fibroid, image 16/6. Within the right side of the uterine fundus there is a 2.1 cm intramural fibroid, image 10/6. Within the distal posterior myometrium intramural fibroid measures 1.7 cm, image 15/6. Anterior sub mucosal fibroid measures 0.7 cm, image 13/6. This may represent a large endometrial polyp versus submucosal fibroid.  The endometrial stripe measures 9 mm, image 11/6.  Right ovary appears normal measuring 2.3 by 4.3 x 3.2 cm.  Left ovary is enlarged measuring 6.2 x 4.8 x 4.1 cm. Two cysts identified within the left ovary. The largest measures 4.0 cm. No mural nodularity or enhancing septation within the cysts.  Other:  No free fluid or fluid collections.  Musculoskeletal: No suspicious bone lesions identified.  IMPRESSION: 1. Enlarged uterus measuring 590 cc is identified. There is a dominant mass which extends into the endometrial cavity and herniates  into the cervix and dome of vagina. This either represents a large endometrial polyp versus pedunculated submucosal fibroid. This dominant mass largely accounts for the increased size of the uterus. 2. Multiple small intramural and a single small sub mucosal fibroid are noted within and around the fundus of the uterus. 3. Cystic enlargement of the left ovary. Dominant cyst measures 4 cm. No complicating features identified within this cyst such as enhancing septation or mural nodularity. This is almost certainly benign, and no specific imaging follow up is recommended according to the Society of Radiologists in [place] Statement (D Clovis Riley et al. Management of Asymptomatic Ovarian and Other Adnexal Cysts Imaged at [place]: Society of Radiologists in [place] Statement 2010. Radiology 256 (Sept 2010): [id number].).   Electronically Signed   By: Kerby Moors M.D.   On: 12/13/2018 16:55  Approximately one year ago I did a hysteroscopic myomectomy on her for a 7cm submucosal type 1 fibroid and I was able to get about 95% of it, so this is a definite change in what she had before. Patient desires uterine sparing and wants to avoid surgery.  I told her it's extremely rare but must assess for malignancy with rapidly enlarging fibroid and recommend gyn onc eval. Pt amenable to plan.  Durene Romans MD Attending Center for [employer] (Faculty Practice) 12/21/2018 Time: [id number]

## 2018-12-22 ENCOUNTER — Encounter (HOSPITAL_COMMUNITY): Payer: Self-pay | Admitting: Emergency Medicine

## 2018-12-22 ENCOUNTER — Inpatient Hospital Stay (HOSPITAL_COMMUNITY)
Admission: EM | Admit: 2018-12-22 | Discharge: 2018-12-23 | DRG: 812 | Discharge status: Against Medical Advice | Payer: Medicaid Other | Attending: Obstetrics & Gynecology | Admitting: Obstetrics & Gynecology

## 2018-12-22 ENCOUNTER — Encounter: Payer: Self-pay | Admitting: Obstetrics & Gynecology

## 2018-12-22 ENCOUNTER — Other Ambulatory Visit: Payer: Self-pay

## 2018-12-22 DIAGNOSIS — I959 Hypotension, unspecified: Secondary | ICD-10-CM | POA: Diagnosis not present

## 2018-12-22 DIAGNOSIS — D62 Acute posthemorrhagic anemia: Secondary | ICD-10-CM | POA: Diagnosis present

## 2018-12-22 DIAGNOSIS — Z885 Allergy status to narcotic agent status: Secondary | ICD-10-CM

## 2018-12-22 DIAGNOSIS — R1111 Vomiting without nausea: Secondary | ICD-10-CM | POA: Diagnosis not present

## 2018-12-22 DIAGNOSIS — N9489 Other specified conditions associated with female genital organs and menstrual cycle: Secondary | ICD-10-CM | POA: Insufficient documentation

## 2018-12-22 DIAGNOSIS — D259 Leiomyoma of uterus, unspecified: Secondary | ICD-10-CM | POA: Diagnosis present

## 2018-12-22 DIAGNOSIS — N939 Abnormal uterine and vaginal bleeding, unspecified: Secondary | ICD-10-CM

## 2018-12-22 DIAGNOSIS — Z8249 Family history of ischemic heart disease and other diseases of the circulatory system: Secondary | ICD-10-CM

## 2018-12-22 DIAGNOSIS — Z20828 Contact with and (suspected) exposure to other viral communicable diseases: Secondary | ICD-10-CM | POA: Diagnosis present

## 2018-12-22 DIAGNOSIS — D649 Anemia, unspecified: Secondary | ICD-10-CM | POA: Diagnosis present

## 2018-12-22 DIAGNOSIS — D500 Iron deficiency anemia secondary to blood loss (chronic): Secondary | ICD-10-CM | POA: Diagnosis present

## 2018-12-22 DIAGNOSIS — R1084 Generalized abdominal pain: Secondary | ICD-10-CM | POA: Diagnosis not present

## 2018-12-22 DIAGNOSIS — Z79899 Other long term (current) drug therapy: Secondary | ICD-10-CM | POA: Diagnosis not present

## 2018-12-22 DIAGNOSIS — R1030 Lower abdominal pain, unspecified: Secondary | ICD-10-CM | POA: Diagnosis not present

## 2018-12-22 DIAGNOSIS — R58 Hemorrhage, not elsewhere classified: Secondary | ICD-10-CM | POA: Diagnosis not present

## 2018-12-22 DIAGNOSIS — N920 Excessive and frequent menstruation with regular cycle: Secondary | ICD-10-CM | POA: Diagnosis present

## 2018-12-22 LAB — COMPREHENSIVE METABOLIC PANEL
ALT: 17 U/L (ref 0–44)
AST: 17 U/L (ref 15–41)
Albumin: 3.7 g/dL (ref 3.5–5.0)
Alkaline Phosphatase: 48 U/L (ref 38–126)
Anion gap: 7 (ref 5–15)
BUN: 15 mg/dL (ref 6–20)
CO2: 24 mmol/L (ref 22–32)
Calcium: 8.5 mg/dL — ABNORMAL LOW (ref 8.9–10.3)
Chloride: 107 mmol/L (ref 98–111)
Creatinine, Ser: 0.56 mg/dL (ref 0.44–1.00)
GFR calc Af Amer: >60 mL/min (ref >60)
GFR calc non Af Amer: >60 mL/min (ref >60)
Glucose, Bld: 103 mg/dL — ABNORMAL HIGH (ref 70–99)
Potassium: 3.3 mmol/L — ABNORMAL LOW (ref 3.5–5.1)
Sodium: 138 mmol/L (ref 135–145)
Total Bilirubin: 0.6 mg/dL (ref 0.3–1.2)
Total Protein: 6.4 g/dL — ABNORMAL LOW (ref 6.5–8.1)

## 2018-12-22 LAB — CBC WITH DIFFERENTIAL/PLATELET
Abs Immature Granulocytes: 0.08 10*3/uL — ABNORMAL HIGH (ref 0.00–0.07)
Basophils Absolute: 0 10*3/uL (ref 0.0–0.1)
Basophils Relative: 0 %
Eosinophils Absolute: 0.3 10*3/uL (ref 0.0–0.5)
Eosinophils Relative: 3 %
HCT: 18.6 % — ABNORMAL LOW (ref 36.0–46.0)
Hemoglobin: 4.4 g/dL — CL (ref 12.0–15.0)
Immature Granulocytes: 1 %
Lymphocytes Relative: 25 %
Lymphs Abs: 2.1 10*3/uL (ref 0.7–4.0)
MCH: 14.9 pg — ABNORMAL LOW (ref 26.0–34.0)
MCHC: 23.7 g/dL — ABNORMAL LOW (ref 30.0–36.0)
MCV: 63.1 fL — ABNORMAL LOW (ref 80.0–100.0)
Monocytes Absolute: 0.4 10*3/uL (ref 0.1–1.0)
Monocytes Relative: 5 %
Neutro Abs: 5.5 10*3/uL (ref 1.7–7.7)
Neutrophils Relative %: 66 %
Platelets: 516 10*3/uL — ABNORMAL HIGH (ref 150–400)
RBC: 2.95 MIL/uL — ABNORMAL LOW (ref 3.87–5.11)
RDW: 23 % — ABNORMAL HIGH (ref 11.5–15.5)
WBC: 8.4 10*3/uL (ref 4.0–10.5)
nRBC: 0.5 % — ABNORMAL HIGH (ref 0.0–0.2)

## 2018-12-22 LAB — PREPARE RBC (CROSSMATCH)

## 2018-12-22 LAB — I-STAT BETA HCG BLOOD, ED (MC, WL, AP ONLY): I-stat hCG, quantitative: <5 m[IU]/mL (ref <5)

## 2018-12-22 LAB — SARS CORONAVIRUS 2 BY RT PCR (HOSPITAL ORDER, PERFORMED IN ~~LOC~~ HOSPITAL LAB): SARS Coronavirus 2: NEGATIVE

## 2018-12-22 MED ORDER — ESTROGENS CONJUGATED 25 MG IJ SOLR
25.0000 mg | Freq: Once | INTRAMUSCULAR | Status: AC
  Administered 2018-12-22: 25 mg via INTRAVENOUS
  Filled 2018-12-22: qty 25

## 2018-12-22 MED ORDER — SODIUM CHLORIDE 0.9% IV SOLUTION
Freq: Once | INTRAVENOUS | Status: AC
  Administered 2018-12-22: 17:00:00 via INTRAVENOUS

## 2018-12-22 MED ORDER — KETOROLAC TROMETHAMINE 15 MG/ML IJ SOLN
15.0000 mg | Freq: Once | INTRAMUSCULAR | Status: AC
  Administered 2018-12-22: 15 mg via INTRAVENOUS
  Filled 2018-12-22: qty 1

## 2018-12-22 NOTE — ED Notes (Signed)
Informed consent for blood is signed and in chart.

## 2018-12-22 NOTE — ED Notes (Signed)
Pelvic setup is bedside.

## 2018-12-22 NOTE — ED Triage Notes (Signed)
Patient arrived by EMS from home. Pt c/o vaginal bleeding x 3 days w/ "large clots" per EMS. PT had episode of vomiting before arrival by EMS.   PT c/o of period cramps.   PT took aleve last night w/ some relief.   BP was 98 systolic per EMS .

## 2018-12-22 NOTE — ED Notes (Signed)
CRITICAL VALUE STICKER  CRITICAL VALUE: Hgb 4.4   RECEIVER (on-site recipient of call): Juventino Slovak, RN  DATE & TIME NOTIFIED: 12/22/2018 1621   MESSENGER (representative from lab): Angelina Ok   MD NOTIFIED: MD C[place]: [id number]  RESPONSE:

## 2018-12-22 NOTE — ED Provider Notes (Signed)
[facility] DEPT Provider Note   CSN: [id number] Arrival date & time: 12/22/18  1508     History   Chief Complaint Chief Complaint  Patient presents with  . Vaginal Bleeding    HPI Becky Larson is a 38 y.o. female.     Patient is a 38 year old female with a history of chronic anemia from fibroids with history of fibroid surgery approximately 1 year ago who presents today with 3 days of worsening vaginal bleeding.  She states today there are large clots coming out and heavy bleeding.  She had shortness of breath, near syncope, dizziness.  She has been extremely fatigued with no energy.  Patient states she chronically has a hemoglobin in the 6 range but had been feeling okay until today.  Patient recently had an MRI done by Dr. Ilda Basset her OB/GYN that showed a large pedunculated fibroid.  Patient currently is taking no medication but did take an ibuprofen earlier today.  She is allergic to morphine but otherwise has no other medication allergies.  N.p.o. since 2 PM.  The history is provided by the patient.  Vaginal Bleeding Quality:  Bright red, heavier than menses and clots Severity:  Severe Onset quality:  Gradual Duration:  3 days Timing:  Constant Progression:  Worsening Chronicity:  Recurrent Menstrual history:  Regular Number of pads used:  Using a diaper because blood is pooring since mid morning Possible pregnancy: no   Context: spontaneously   Relieved by:  Nothing Worsened by:  Activity Ineffective treatments:  Rest Associated symptoms: abdominal pain and vaginal discharge   Associated symptoms: no back pain, no dysuria, no fever and no nausea   Risk factors: gynecological surgery and unprotected sex   Risk factors: no bleeding disorder, does not have multiple partners, no STD and no STD exposure     Past Medical History:  Diagnosis Date  . Anemia    due to blood loss  . Dyspnea    due to exertion   . Ectopic pregnancy    Right    Patient Active Problem List   Diagnosis Date Noted  . Anemia 03/18/2018  . Menorrhagia 03/18/2018  . 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 HYSTEROSCOPY W/D&C N/A 01/05/2018   Procedure: DILATATION AND CURETTAGE /HYSTEROSCOPY/MYOMECTOMY/POLYPECTOMY;  Surgeon: Aletha Halim, MD;  Location: [place];  Service: Gynecology;  Laterality: N/A;  . LAPAROSCOPIC SALPINGOOPHERECTOMY Right    ectopic     OB History    Gravida  7   Para  2   Term  2   Preterm  0   AB  5   Living  2     SAB  3   TAB  2   Ectopic  0   Multiple  0   Live Births  2            Home Medications    Prior to Admission medications   Medication Sig Start Date End Date Taking? Authorizing Provider  ferrous sulfate 325 (65 FE) MG tablet Take 1 tablet (325 mg total) by mouth 2 (two) times daily with a meal. Patient not taking: Reported on 11/11/2018 01/05/18   Aletha Halim, MD  LORazepam (ATIVAN) 2 MG tablet Take 1 tablet (2 mg total) by mouth once as needed for up to 1 dose for anxiety (take prior to MRI). 11/30/18   Aletha Halim, MD  naproxen sodium (ALEVE) 220 MG tablet  Take 220 mg by mouth.    [provider]    Family History Family History  Problem Relation Age of Onset  . Hypertension Father   . Hypertension Paternal Grandfather   . Heart attack Paternal Grandfather     Social History Social History   Tobacco Use  . Smoking status: Never Smoker  . Smokeless tobacco: Never Used  Substance Use Topics  . Alcohol use: Yes    Comment: socially  . Drug use: Yes    Frequency: 7.0 times per week    Types: Marijuana    Comment: uses daily     Allergies   Morphine and related   Review of Systems Review of Systems  Constitutional: Negative for fever.  Gastrointestinal: Positive for abdominal pain. Negative for nausea.  Genitourinary: Positive for vaginal bleeding  and vaginal discharge. Negative for dysuria.  Musculoskeletal: Negative for back pain.  All other systems reviewed and are negative.    Physical Exam Updated Vital Signs BP 114/61 (BP Location: Right Arm)   Pulse 96   Temp 98.5 F (36.9 C) (Oral)   Resp 19   Ht 5\' 6"  (1.676 m)   Wt 74.8 kg   LMP 12/20/2018   SpO2 100%   BMI 26.63 kg/m   Physical Exam Vitals signs and nursing note reviewed.  Constitutional:      General: She is not in acute distress.    Appearance: She is well-developed and normal weight.  HENT:     Head: Normocephalic and atraumatic.     Mouth/Throat:     Comments: Pale oral mucosa Eyes:     Pupils: Pupils are equal, round, and reactive to light.     Comments: Pale conjunctive a  Neck:     Musculoskeletal: Normal range of motion and neck supple.  Cardiovascular:     Rate and Rhythm: Normal rate and regular rhythm.     Heart sounds: Murmur present.  Pulmonary:     Effort: Pulmonary effort is normal. No respiratory distress.     Breath sounds: Normal breath sounds. No wheezing or rales.  Abdominal:     General: There is no distension.     Palpations: Abdomen is soft.     Tenderness: There is abdominal tenderness in the suprapubic area. There is no right CVA tenderness, left CVA tenderness, guarding or rebound.  Genitourinary:    Cervix: Cervical bleeding present.     Uterus: Enlarged and tender.      Comments: Large clot present in the vault with some blood pooling.  Firm hard lesion palpated around the cervix area on bimanual exam.  Pt is tender throughout Musculoskeletal: Normal range of motion.        General: No tenderness.  Skin:    General: Skin is warm and dry.     Coloration: Skin is pale.     Findings: No erythema or rash.  Neurological:     General: No focal deficit present.     Mental Status: She is alert and oriented to person, place, and time. Mental status is at baseline.  Psychiatric:        Mood and Affect: Mood normal.         Behavior: Behavior normal.        Thought Content: Thought content normal.      ED Treatments / Results  Labs (all labs ordered are listed, but only abnormal results are displayed) Labs Reviewed  CBC WITH DIFFERENTIAL/PLATELET - Abnormal; Notable for the following components:  Result Value   RBC 2.95 (*)    Hemoglobin 4.4 (*)    HCT 18.6 (*)    MCV 63.1 (*)    MCH 14.9 (*)    MCHC 23.7 (*)    RDW 23.0 (*)    Platelets 516 (*)    nRBC 0.5 (*)    Abs Immature Granulocytes 0.08 (*)    All other components within normal limits  COMPREHENSIVE METABOLIC PANEL - Abnormal; Notable for the following components:   Potassium 3.3 (*)    Glucose, Bld 103 (*)    Calcium 8.5 (*)    Total Protein 6.4 (*)    All other components within normal limits  WET PREP, GENITAL  I-STAT BETA HCG BLOOD, ED ([facility], [facility], [facility] ONLY)  TYPE AND SCREEN  PREPARE RBC (CROSSMATCH)  GC/CHLAMYDIA PROBE AMP ([place]) NOT AT [facility]    EKG None  Radiology No results found.  Procedures Procedures (including critical care time)  Medications Ordered in ED Medications  ketorolac (TORADOL) 15 MG/ML injection 15 mg (has no administration in time range)     Initial Impression / Assessment and Plan / ED Course  I have reviewed the triage vital signs and the nursing notes.  Pertinent labs & imaging results that were available during my care of the patient were reviewed by me and considered in my medical decision making (see chart for details).        Patient is a 38 year old female presenting today with worsening vaginal bleeding.  Patient recently had an MR done that showed a pedunculated fibroid but has had heavy bleeding today and near syncope.  Patient is chronically anemic with hemoglobins as low as 6 but today was the first day she was feeling symptomatic.  Upon arrival here patient is hemodynamically stable.  She is having some vaginal bleeding on exam but there is a firm palpable mass and large clots  present on exam.  Patient's hemoglobin today is 4.4 hCG is negative.  Patient will be transfused 2 units.  Spoke with Anyanwu with OB/GYN and she will prescribe IV 25 mg of Premarin and patient will be transferred to [facility] [place] Performed by: Tish Begin Total critical care time: 30 minutes Critical care time was exclusive of separately billable procedures and treating other patients. Critical care was necessary to treat or prevent imminent or life-threatening deterioration. Critical care was time spent personally by me on the following activities: development of treatment plan with patient and/or surrogate as well as nursing, discussions with consultants, evaluation of patient's response to treatment, examination of patient, obtaining history from patient or surrogate, ordering and performing treatments and interventions, ordering and review of laboratory studies, ordering and review of radiographic studies, pulse oximetry and re-evaluation of patient's condition.   Final Clinical Impressions(s) / ED Diagnoses   Final diagnoses:  Vaginal bleeding  Symptomatic anemia    ED Discharge Orders    None       Blanchie Dessert, MD 12/22/18 1704

## 2018-12-22 NOTE — Progress Notes (Signed)
   Gynecology Attending Note  Becky Larson is a 38 y.o. [phone] who presented to [facility] ED today for evaluation of heavy bleeding in the setting of a recently diagnosed large 9 cm endometrial mass (large pedunculated submucosal fibroid or polyp suspected). Patient has a history of chronic anemia due to AUB and fibroids, hemoglobin had been 6.1-8.6 last years.  She has a history of hysteroscopic myomectomy of a 7 cm submucosal fibroid last year in 12/2017 by Dr. Ilda Basset, but has recurrence of an endometrial mass this year as evidenced on MRI done 12/13/18 (see report in chart).  Dr. Ilda Basset was in the process of referring her GYN ONC for evaluation (see his note on 12/21/18).   However, she started having heavy bleeding with large clots earlier today, associated with shortness of breath, dizziness, fatigue and near syncope.  She presented to [facility] ED and was noted to have significant bleeding on exam and a large mass near her cervix. Hemoglobin was noted to be 4.4. She was ordered for transfusion of 2 units of pRBCs and our service was consulted. Given ongoing bleeding, Premarin 25 mg IV x 1 was ordered and the patient was to be transferred here to [facility] for admission and further evaluation.    As per conversation with the patient's nurses at [facility] ED, the patient refused pre-admission COVID screening, but finally relented and tested negative. But she then refused to be transferred to [facility] for observation, and wants to follow up outpatient with Dr. Ilda Basset. She ate food that was brought by her husband (after being advised to be NPO) and reports her bleeding is reduced. Also reported she feels less lightheaded after receiving transfusion. As the plan was to observe her at least overnight to make sure the bleeding does not recur, and also determine need for further blood products or surgical intervention; she was told she would have have to sign AMA papers prior to leaving the ED. She was also advised to come back to [facility] ED  for any further bleeding or gynecologic issues as we are physically at [facility].  This documentation was forwarded to Dr. Ilda Basset.    Verita Schneiders, MD, [place] for [employer], [place]

## 2018-12-22 NOTE — ED Notes (Signed)
Patient refused COVID-19 test. Patient stated "I'm not getting it". This Probation officer made MD Plunkett aware.

## 2018-12-23 ENCOUNTER — Telehealth: Payer: Self-pay | Admitting: *Deleted

## 2018-12-23 NOTE — ED Notes (Signed)
Pt leaving AMA. She refuses to sign the [facility] paperwork because we will not give her pain medication before she leaves. I was able to remove the IV and when I asked her to sign, she states, "um, no."

## 2018-12-23 NOTE — Telephone Encounter (Signed)
-----   Message from Mallie Darting sent at 12/22/2018  2:49 PM EDT -----  ----- Message ----- From: Aletha Halim, MD Sent: 12/21/2018   1:14 PM EDT To: [facility] Admin Pool  Patient needs gyn onc referral. Referral placed in epic. thanks

## 2018-12-23 NOTE — Telephone Encounter (Signed)
Called and spoke with the patient. Scheduled an appt for 10/12. Gave the instructions for parking and no visitors

## 2018-12-23 NOTE — Telephone Encounter (Signed)
I called GYN / ONC at [phone] to schedule referral as ordered. I was informed they received the referral and will review with Dr. Denman George today and then will call the patient . I asked if they could also notify us as well and was informed they will.  I called Becky Larson and notified her that Dr. Ilda Basset has made referral to GYN/ ONc which she says she is aware of. I informed her they will review her chart with one of their doctors  to make sure they can assist her and will call her later today and will notify us as well. She voice understanding. Linda,RN

## 2018-12-24 LAB — TYPE AND SCREEN
ABO/RH(D): O POS
Antibody Screen: NEGATIVE
Unit division: 0
Unit division: 0

## 2018-12-24 LAB — BPAM RBC
Blood Product Expiration Date: 202011042359
Blood Product Expiration Date: 202011042359
ISSUE DATE / TIME: 202010011951
ISSUE DATE / TIME: 202010020003
Unit Type and Rh: 5100
Unit Type and Rh: 5100

## 2018-12-26 ENCOUNTER — Telehealth: Payer: Self-pay | Admitting: *Deleted

## 2018-12-26 NOTE — Telephone Encounter (Signed)
-----   Message from Thomos Lemons, NT sent at 12/23/2018 12:52 PM EDT ----- Attempted to call the patient with no answer. I will try again this afternoon   Sharyn Lull

## 2018-12-26 NOTE — Telephone Encounter (Signed)
I called Becky Larson and she confirmed she has gotten a call from [place] with her appointment for 01/02/19. She voices appreciation for fu call. Brookes Craine,RN

## 2018-12-31 NOTE — Progress Notes (Signed)
GYNECOLOGIC ONCOLOGY NEW PATIENT CONSULTATION   Patient Name: Becky Larson  Patient Age: 38 y.o. Date of Service: 01/02/19  Referring Provider: Aletha Halim, MD  Primary Care Provider: Patient, No Pcp Per Consulting Provider: Jeral Pinch, MD   Assessment/Plan:  Large prolapsing intrauterine fibroid, severe symptomatic anemia secondary to blood loss  Reviewed with the patient the findings on her recent MRI as well as on pelvic exam.  She has a large fibroid that is prolapsing through and has completely dilated the cervix, filling the upper portion of the vagina.  The patient initially voiced wanting to move forward with definitive treatment but ultimately felt very strongly that at this point she would like to avoid hysterectomy if at all possible.  The patient was trying to achieve pregnancy in 2019 when her initial intracavitary fibroid was diagnosed.  We discussed treatment options including hysterectomy, abdominal myomectomy, uterine artery embolization, and Lupron.  Patient was quite interested in abdominal myomectomy although I think the difficulty of this procedure would lead to a significant risk of blood loss as well as ultimate need for hysterectomy.  While uterine artery embolization and Lupron may both help some with her bleeding, I am concerned that they may not decrease the size of her fibroid which has grown rather quickly in a year.  In terms of hysterectomy, at this time I think hysterectomy could be quite challenging given the size of the prolapsing portion of her fibroid.  It would likely require a modified radical hysterectomy that could be attempted robotically.  The patient had multiple concerns about hysterectomy including the loss of her ovaries.  We discussed that plan would be for removal of her remaining fallopian tube and leaving both ovaries in situ.  I have voiced my concern regarding her continued and symptomatic anemia.  We will plan to check a CBC today and  I will call the patient with the results.  I have recommended that she start iron and that we possibly consider an iron transfusion in the future.  Given prior experience with constipation while taking iron, a prescription for Senokot was also sent to her pharmacy.  After a long discussion, the patient felt most comfortable with proceeding with Lupron injection, with the understanding that if she has significant bleeding or continued anemia requiring transfusion, that I would recommend that we move towards definitive surgical management.  There is currently a shortage of Lupron within the hospital.  The patient will return tomorrow or Wednesday for a month-long Depo-Lupron injection (as we have to obtain it from [facility] main pharmacy).  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  Pap at that time was normal. Given exam, repeat pap can not be performed today.  A copy of this note was sent to the patient's referring provider.   Jeral Pinch, MD  Division of Gynecologic Oncology  Department of Obstetrics and Gynecology  [facility]  ___________________________________________  Chief Complaint: Chief Complaint  Patient presents with  . Intramural and submucous leiomyoma of uterus    History of Present Illness:  Becky Larson is a 38 y.o. y.o. female who is seen in consultation at the request of Dr. Ilda Basset for an evaluation of a large prolapsing uterine fibroid with resultant iron deficiency anemia secondary to blood loss.  The patient endorses 2 years of heavy menstrual bleeding starting in 2018.  When she developed large clots, she was seen in the emergency department and then  referred to Dr. Ilda Basset.  She ultimately underwent hysteroscopic myomectomy in October 2019 with pathology revealing fragments of benign smooth muscle consistent with leiomyoma, no malignancy was noted.  An endometrial biopsy prior to surgery also showed no  malignancy.  Findings at the time of surgery included a 14-16 cm uterus on bimanual exam.  On hysteroscopy, there was a large intracavitary fibroid measuring 6-7 cm in size with a large flat base on the posterior left aspect of the cavity, starting approximately 1 cm past the internal os.  The fibroid was noted to be quite firm and avascular.  At the completion of the surgery, a small portion of the flat base remained intact.    After the myomectomy, [name] states that her bleeding significantly improved.  She was seen in January for a Depo-Provera injection.  Secondary to numerous side effects, she only received 1 injection.  She began to have increased frequency of bleeding which she attributed to the Depo.  Her bleeding slowly normalize but she developed heavier flow as well with passage of clots.  She was seen on 8/21 for repeat Pap test as she had had a Pap the year prior and was HPV high risk positive.  At that time a mass prolapsing into the vagina was noted and biopsied.  Pathology from this revealed ulcerated granulation type tissue, no malignancy.  Plan was to obtain an MRI and discuss treatment options.  She was seen at the beginning of October in the emergency department with severe symptomatic anemia.  She notes feeling numb, cold and could not feel her lips at the time.  She also endorsed shortness of breath, dizziness and near syncope.  Her hemoglobin was noted to be 4.4.  She received 2 units of packed red blood cells.  The recommendation was made for admission although the patient ultimately declined this and left AMA.  She received 1 dose of IV Premarin.  The patient has been chronically anemic over the last 14 months with a hemoglobin ranging between 6.1 and 8.6 until her recent ED visit.  Recent MRI shows a large prolapsing and calcified fibroid.  Overall the patient feels much better after her transfusion last week.  She denies any shortness of breath.  She has occasional dizziness  when going up stairs and continues to have fatigue during her menses.  Has not had any significant bleeding since her visit to the emergency department.  She has occasional discharge as well as spotting with bowel movements which she notes is vaginal.  Her menses now last 3 to 4 days and occur monthly with no intermenstrual bleeding.  She denies any associated cramping or pain.  In a 24-hour time.,  She will often go through a 24 pack of overnight pads that are completely saturated.  She denies any change to her bowel function and notes urinary frequency that has developed over the last several months.  She endorses a good appetite with rare nausea no vomiting.  Last pap: 10/2017 - negative, HPV 16/18/45 negative, HPV HR positive  Patient is currently sexually active with her husband.  She was trying to get pregnant prior to the discovery of her intracavitary fibroid in 2019.  She is currently sexually active and not using contraception.  PAST MEDICAL HISTORY:  Past Medical History:  Diagnosis Date  . Anemia    due to blood loss  . Dyspnea    due to exertion   . Ectopic pregnancy    Right  . Fibroid uterus  PAST SURGICAL HISTORY:  Past Surgical History:  Procedure Laterality Date  . CESAREAN SECTION    . HYSTEROSCOPY W/D&C N/A 01/05/2018   Procedure: DILATATION AND CURETTAGE /HYSTEROSCOPY/MYOMECTOMY/POLYPECTOMY;  Surgeon: Aletha Halim, MD;  Location: [place];  Service: Gynecology;  Laterality: N/A;  . LAPAROSCOPIC SALPINGOOPHERECTOMY Right    ectopic    OB/GYN HISTORY:  OB History  Gravida Para Term Preterm AB Living  7 2 2  0 5 2  SAB TAB Ectopic Multiple Live Births  3 2 0 0 2    # Outcome Date GA Lbr Len/2nd Weight Sex Delivery Anes PTL Lv  7 Term           6 Term           5 SAB           4 SAB           3 SAB           2 TAB           1 TAB             Patient's last menstrual period was 12/20/2018.  Age at menarche: 17 Last pap: 10/2017,  HPV HR + (16/18/45 negative) History of abnormal pap smears: see above  MEDICATIONS: Outpatient Encounter Medications as of 01/02/2019  Medication Sig  . ferrous sulfate 325 (65 FE) MG tablet Take 1 tablet (325 mg total) by mouth daily with breakfast.  . senna (SENOKOT) 8.6 MG TABS tablet Take 1 tablet (8.6 mg total) by mouth daily as needed for mild constipation.  . [DISCONTINUED] ferrous sulfate 325 (65 FE) MG tablet Take 1 tablet (325 mg total) by mouth 2 (two) times daily with a meal. (Patient not taking: Reported on 11/11/2018)  . [DISCONTINUED] LORazepam (ATIVAN) 2 MG tablet Take 1 tablet (2 mg total) by mouth once as needed for up to 1 dose for anxiety (take prior to MRI). (Patient not taking: Reported on 12/22/2018)  . [DISCONTINUED] naproxen sodium (ALEVE) 220 MG tablet Take 220 mg by mouth daily as needed (pain).    No facility-administered encounter medications on file as of 01/02/2019.     ALLERGIES:  Allergies  Allergen Reactions  . Morphine And Related Itching    respritatory issues as well     FAMILY HISTORY:  Family History  Problem Relation Age of Onset  . Hypertension Father   . Hypertension Paternal Grandfather   . Heart attack Paternal Grandfather   . Lung cancer Paternal Grandfather   . Colon cancer Maternal Grandmother   . Lung cancer Paternal Grandmother   . Lung cancer Maternal Grandfather   . Ovarian cancer Neg Hx   . Uterine cancer Neg Hx   . Cervical cancer Neg Hx   . Breast cancer Neg Hx      SOCIAL HISTORY:  Relationships  Social connections  . Talks on phone: Not on file  . Gets together: Not on file  . Attends religious service: Not on file  . Active member of club or organization: Not on file  . Attends meetings of clubs or organizations: Not on file  . Relationship status: Not on file    REVIEW OF SYSTEMS:  Constitutional  Feels well,  + fatigue  ENT Normal appearing ears and nares bilaterally Skin/Breast  No rash, sores, jaundice,  itching, dryness Cardiovascular  No chest pain, shortness of breath, or edema  Pulmonary  No cough or wheeze.  Gastro Intestinal  No nausea, vomitting,  or diarrhoea. No bright red blood per rectum, no abdominal pain, change in bowel movement, or constipation.  Genito Urinary  No urgency, dysuria, + frequency Musculo Skeletal  No myalgia, arthralgia, joint swelling or pain  Neurologic  No weakness, numbness, change in gait,  Psychology  No depression, anxiety, insomnia.   Physical Exam:  Vital Signs for this encounter:  Blood pressure 118/74, pulse 99, temperature 98.5 F (36.9 C), temperature source Tympanic, resp. rate 20, height 5\' 6"  (1.676 m), weight 164 lb 9.6 oz (74.7 kg), last menstrual period 12/20/2018. Body mass index is 26.57 kg/m. General: Alert, oriented, no acute distress.  HEENT: Normocephalic, atraumatic. Sclera anicteric. + conjunctival pallor.  Chest: Clear to auscultation bilaterally.  Cardiovascular: Regular rate and rhythm, no murmurs, rubs, or gallops.  Abdomen: Normoactive bowel sounds. Soft, nondistended, nontender to palpation. No hepatosplenomegaly appreciated. Uterus appreciated in the midline just above the symphysis. No palpable fluid wave.  Extremities: Grossly normal range of motion. Warm, well perfused. No edema bilaterally.  Skin: No rashes or lesions.  Lymphatics: No cervical, supraclavicular, or inguinal adenopathy.  GU:  Normal external female genitalia. No lesions. No discharge or bleeding.             Bladder/urethra:  No lesions or masses, well supported bladder             Vagina: well rugated.             Cervix: Not visualized secondary to large mass prolapsing into the vagina. On exam, mass approximately 8cm in diameter apparently prolapsing through dilated cervix, although normal cervix not appreciated on exam.              Uterus: Enlarged with very broad LUS and cervix secondary to prolapsing mass, mobile, no parametrial involvement or  nodularity.             Adnexa: no masses appreciated.  Rectal: deferred  LABORATORY AND RADIOLOGIC DATA:   MRI 11/2018 CLINICAL DATA:  Intra-abdominal and pelvic swelling. Evaluate fibroids.  EXAM: MRI PELVIS WITHOUT AND WITH CONTRAST  TECHNIQUE: Multiplanar multisequence MR imaging of the pelvis was performed both before and after administration of intravenous contrast.  CONTRAST:  67mL GADAVIST GADOBUTROL 1 MMOL/ML IV SOLN  COMPARISON:  Ultrasound 11/13/2017  FINDINGS: Urinary Tract:  No abnormality visualized.  Bowel:  Unremarkable visualized pelvic bowel loops.  Vascular/Lymphatic: No pathologically enlarged lymph nodes. No significant vascular abnormality seen.  Reproductive:  Uterus: 15.7 x 8.3 by 8.6 cm (volume = 590 cm^3). Multiple uterine masses are identified.  The dominant mass arises posteriorly and extends into the into the endometrial cavity and with herniation into the cervix and dome of vagina. This measures 9.5 x 9.1 by 8.6 cm (volume = 390 cm^3).  Within the left anterior myometrium there is a 2.2 cm intramural fibroid, image 16/6. Within the right side of the uterine fundus there is a 2.1 cm intramural fibroid, image 10/6. Within the distal posterior myometrium intramural fibroid measures 1.7 cm, image 15/6. Anterior sub mucosal fibroid measures 0.7 cm, image 13/6. This may represent a large endometrial polyp versus submucosal fibroid.  The endometrial stripe measures 9 mm, image 11/6.  Right ovary appears normal measuring 2.3 by 4.3 x 3.2 cm.  Left ovary is enlarged measuring 6.2 x 4.8 x 4.1 cm. Two cysts identified within the left ovary. The largest measures 4.0 cm. No mural nodularity or enhancing septation within the cysts.  Other:  No free fluid or fluid collections.  Musculoskeletal: No  suspicious bone lesions identified.  IMPRESSION: 1. Enlarged uterus measuring 590 cc is identified. There is a dominant mass which extends into  the endometrial cavity and herniates into the cervix and dome of vagina. This either represents a large endometrial polyp versus pedunculated submucosal fibroid. This dominant mass largely accounts for the increased size of the uterus. 2. Multiple small intramural and a single small sub mucosal fibroid are noted within and around the fundus of the uterus. 3. Cystic enlargement of the left ovary. Dominant cyst measures 4 cm. No complicating features identified within this cyst such as enhancing septation or mural nodularity. This is almost certainly benign, and no specific imaging follow up is recommended according to the Society of Radiologists in [place] Statement (D Clovis Riley et al. Management of Asymptomatic Ovarian and Other Adnexal Cysts Imaged at [place]: Society of Radiologists in [place] Statement 2010. Radiology 256 (Sept 2010): 943-954.).   Pelvic ultrasound 10/2018: EXAM: TRANSABDOMINAL ULTRASOUND OF PELVIS  FINDINGS: Uterus  Measurements: 15.8 x 9.3 x 9.7 cm. Heterogeneous echotexture. Focal intramural fundal fibroid anteriorly measures up to 2.6 cm.  Endometrium  Thickness: Markedly thickened, measuring up to 6.4 cm in thickness. Heterogeneous mass noted within the endometrium measures 8.2 x 7.1 x 5.8 cm. Internal blood flow noted.  Right ovary  Measurements: Not visualized.  No adnexal mass seen.  Left ovary  Measurements: 4.6 x 2.4 x 2.5 cm. Normal appearance/no adnexal mass.  Other findings:  No abnormal free fluid.  IMPRESSION: Enlarged, heterogeneous myometrium. Focal fundal fibroid measures 2.6 cm.  Large heterogeneous endometrial mass measuring up to 8.2 cm with internal blood flow. Cannot exclude endometrial cancer. Recommend tissue sampling.  Lafonda Mosses, MD  01/02/2019, 4:15 PM

## 2019-01-02 ENCOUNTER — Ambulatory Visit (HOSPITAL_COMMUNITY): Admission: RE | Admit: 2019-01-02 | Payer: Medicaid Other | Source: Ambulatory Visit

## 2019-01-02 ENCOUNTER — Inpatient Hospital Stay (HOSPITAL_BASED_OUTPATIENT_CLINIC_OR_DEPARTMENT_OTHER): Payer: Medicaid Other | Admitting: Gynecologic Oncology

## 2019-01-02 ENCOUNTER — Encounter: Payer: Self-pay | Admitting: Gynecologic Oncology

## 2019-01-02 ENCOUNTER — Telehealth: Payer: Self-pay

## 2019-01-02 ENCOUNTER — Other Ambulatory Visit: Payer: Self-pay

## 2019-01-02 ENCOUNTER — Inpatient Hospital Stay: Payer: Medicaid Other | Attending: Gynecologic Oncology

## 2019-01-02 ENCOUNTER — Other Ambulatory Visit: Payer: Self-pay | Admitting: Gynecologic Oncology

## 2019-01-02 VITALS — BP 118/74 | HR 99 | Temp 98.5°F | Resp 20 | Ht 66.0 in | Wt 164.6 lb

## 2019-01-02 DIAGNOSIS — D649 Anemia, unspecified: Secondary | ICD-10-CM

## 2019-01-02 DIAGNOSIS — Z90721 Acquired absence of ovaries, unilateral: Secondary | ICD-10-CM | POA: Diagnosis not present

## 2019-01-02 DIAGNOSIS — Z800 Family history of malignant neoplasm of digestive organs: Secondary | ICD-10-CM | POA: Diagnosis not present

## 2019-01-02 DIAGNOSIS — D500 Iron deficiency anemia secondary to blood loss (chronic): Secondary | ICD-10-CM | POA: Diagnosis not present

## 2019-01-02 DIAGNOSIS — D250 Submucous leiomyoma of uterus: Secondary | ICD-10-CM

## 2019-01-02 DIAGNOSIS — N83202 Unspecified ovarian cyst, left side: Secondary | ICD-10-CM | POA: Diagnosis not present

## 2019-01-02 DIAGNOSIS — Z803 Family history of malignant neoplasm of breast: Secondary | ICD-10-CM

## 2019-01-02 DIAGNOSIS — Z79899 Other long term (current) drug therapy: Secondary | ICD-10-CM | POA: Diagnosis not present

## 2019-01-02 DIAGNOSIS — D251 Intramural leiomyoma of uterus: Secondary | ICD-10-CM | POA: Insufficient documentation

## 2019-01-02 DIAGNOSIS — Z801 Family history of malignant neoplasm of trachea, bronchus and lung: Secondary | ICD-10-CM | POA: Diagnosis not present

## 2019-01-02 LAB — CBC (CANCER CENTER ONLY)
HCT: 20.4 % — ABNORMAL LOW (ref 36.0–46.0)
Hemoglobin: 5.7 g/dL — CL (ref 12.0–15.0)
MCH: 19 pg — ABNORMAL LOW (ref 26.0–34.0)
MCHC: 27.9 g/dL — ABNORMAL LOW (ref 30.0–36.0)
MCV: 68 fL — ABNORMAL LOW (ref 80.0–100.0)
Platelet Count: 255 10*3/uL (ref 150–400)
RBC: 3 MIL/uL — ABNORMAL LOW (ref 3.87–5.11)
RDW: 26.4 % — ABNORMAL HIGH (ref 11.5–15.5)
WBC Count: 6.7 10*3/uL (ref 4.0–10.5)
nRBC: 0 % (ref 0.0–0.2)

## 2019-01-02 MED ORDER — FERROUS SULFATE 325 (65 FE) MG PO TABS: 325.0000 mg | ORAL_TABLET | Freq: Every day | ORAL | 3 refills | Status: DC

## 2019-01-02 MED ORDER — SENNA 8.6 MG PO TABS: 1.0000 | ORAL_TABLET | Freq: Every day | ORAL | 2 refills | Status: DC | PRN

## 2019-01-02 NOTE — Telephone Encounter (Signed)
Ms Yarrow's  Hgb was 5.7 today. Dr. Berline Lopes wants pt to receive a transfusion on 01-04-19 Sent Scheduling message to Melissa X to schedule T&C for 0800 10-14 and transfusion for @ units at 0900 at [facility]. Have not been able to reach patient at home to inform her of labs and needs for transfusion.

## 2019-01-02 NOTE — Patient Instructions (Signed)
Clinic information Gynecologic Oncology Ph # [phone] Dr. Berline Lopes  Today, we discussed options for treatment of the large uterine fibroid that you have that is causing your heavy periods. We discussed hysterectomy (removal of the uterus and cervix, saving the ovaries), open myomectomy (removing your uterus through an abdominal incision, with the risk you may need a hysterectomy), uterine ablation (blocking the blood flow to some of the major vessels that supply the uterus with blood), and Lupron.  Given your desire to avoid surgery at this time and to keep your uterus if possible, we are going to try Lupron to decrease your bleeding and hopefully help shrink the fibroid.  Here is some information about the medication:  Leuprolide:   Brand Names: [place] Eligard; Fensolvi (6 Month); Lupron Depot (610-Month); Lupron Depot (364-Month); Lupron Depot (45-Month); Lupron Depot (10-Month); Lupron Depot-Ped (610-Month); Lupron Depot-Ped (364-Month) Brand Names: [place] Eligard; Lupron; Lupron Depot; Lupron Depot (610-Month); Lupron Depot (364-Month); Lupron Depot 3 Month Kit; Zeulide Depot What is this drug used for? . It is used to treat endometriosis. . It is used to treat anemia caused by fibroids of the uterus. . It is used to treat prostate cancer. . It may be given to you for other reasons. Talk with the doctor. What do I need to tell my doctor BEFORE I take this drug? For all patients taking this drug: . If you are allergic to this drug; any part of this drug; or any other drugs, foods, or substances. Tell your doctor about the allergy and what signs you had. Women: . If you have unexplained vaginal bleeding. . If you are pregnant or may be pregnant. Do not take this drug if you are pregnant. . If you are breast-feeding. Do not breast-feed while you take this drug. This is not a list of all drugs or health problems that interact with this drug. Tell your doctor and pharmacist about all of your drugs  (prescription or OTC, natural products, vitamins) and health problems. You must check to make sure that it is safe for you to take this drug with all of your drugs and health problems. Do not start, stop, or change the dose of any drug without checking with your doctor. What are some things I need to know or do while I take this drug? For all patients taking this drug: . Tell all of your health care providers that you take this drug. This includes your doctors, nurses, pharmacists, and dentists. . This drug may raise some hormone levels in your body during the first few weeks of taking it. Disease signs may get worse before getting better. Tell your doctor if you have any new signs or if your disease signs are worse for longer than a few weeks after starting this drug. . This drug lowers some hormone levels in your body. This may cause some effects like change in breast size, breast soreness or tenderness, testicle changes in men, change in sex ability in men, hot flashes, or sweating. Talk with your doctor. . Have blood work checked as you have been told by the doctor. Talk with the doctor. . High blood sugar has happened with this drug. This includes diabetes that is new or worse. . Check your blood sugar as you have been told by your doctor. . If you smoke, talk with your doctor. . A very bad pituitary gland problem (pituitary apoplexy) has rarely happened with this drug. Most of the time, this has happened within 2 weeks after  the first dose. Call your doctor right away if you have a sudden headache, throwing up, passing out, mood changes, eye weakness, not able to move your eyes, or change in eyesight. . This drug may cause weak bones. This may happen more often if used for a long time. This may raise the chance of broken bones. Call your doctor right away if you have bone pain. . Have a bone density test as you have been told by your doctor. Talk with your doctor. . A higher chance of stroke or  severe and sometimes deadly heart problems have been noted with the use of drugs like this drug in men. The chance is low, but get medical help right away if you have chest pain or pressure, weakness on 1 side of the body, trouble speaking or thinking, change in balance, drooping on 1 side of the face, or change in eyesight. . Some products are not approved for use in people who are 75 or older or in children. Talk with your doctor. Men: . The chance of very bad and sometimes deadly problems may be raised in people who have growths on or near the spine or spinal cord or bladder blockage. Talk with your doctor. . Lowering female hormones in the body may raise the chance of a type of heartbeat that is not normal called prolonged QT interval. Talk with the doctor. . This drug may affect being able to father a child. Talk with the doctor. Women: . This drug may affect being able to get pregnant. This effect goes back to normal when the drug is stopped. If you have questions, talk with the doctor. . Most of the time, this drug stops you from having a period (menstrual bleeding). This is not a method of birth control. Use a non-hormone type of birth control like condoms to prevent pregnancy while taking this drug. . If you miss doses of this drug, bleeding between cycles can happen. Talk with your doctor. . This drug may cause harm to the unborn baby if you take it while you are pregnant. . A pregnancy test will be done to show that you are NOT pregnant before starting this drug. If you get pregnant while taking this drug, call your doctor right away. What are some side effects that I need to call my doctor about right away? WARNING/CAUTION: Even though it may be rare, some people may have very bad and sometimes deadly side effects when taking a drug. Tell your doctor or get medical help right away if you have any of the following signs or symptoms that may be related to a very bad side effect: For all  patients taking this drug: . Signs of an allergic reaction, like rash; hives; itching; red, swollen, blistered, or peeling skin with or without fever; wheezing; tightness in the chest or throat; trouble breathing, swallowing, or talking; unusual hoarseness; or swelling of the mouth, face, lips, tongue, or throat. . Signs of high blood sugar like confusion, feeling sleepy, more thirst, more hungry, passing urine more often, flushing, fast breathing, or breath that smells like fruit. . Signs of infection like fever, chills, very bad sore throat, ear or sinus pain, cough, more sputum or change in color of sputum, pain with passing urine, mouth sores, or wound that will not heal. . Signs of high blood pressure like very bad headache or dizziness, passing out, or change in eyesight. . Signs of dehydration like dry skin, mouth, or eyes; thirst; fast heartbeat;  dizziness; fast breathing; or confusion. . Bone pain that is new or worse. . Not able to pass urine or change in how much urine is passed. . Very bad back pain. . Seizures. . Blood in the urine. . A burning, numbness, or tingling feeling that is not normal. . Very bad dizziness or passing out. . Fast, slow, or abnormal heartbeat. . Shortness of breath, a big weight gain, or swelling in the arms or legs. . Not able to move. . Behavior and mood changes have happened with the use of drugs like this one in children. This includes acting aggressive, crying, depression, emotional ups and downs, restlessness, and feeling angry and irritable. Call your doctor right away if you have any new or worse behavior or mood changes. Women: . For women, still having a period. . Vaginal itching or discharge. . Vaginal irritation. What are some other side effects of this drug? All drugs may cause side effects. However, many people have no side effects or only have minor side effects. Call your doctor or get medical help if any of these side effects or any other  side effects bother you or do not go away: For all patients taking this drug: . Headache. . Feeling dizzy, tired, or weak. . Not hungry. . Constipation. Marland Kitchen Upset stomach or throwing up. . Trouble sleeping. . Irritation where the shot is given. . Muscle or joint pain. . Flu-like signs. Women: . Pimples (acne). . Mood changes. . A change in weight without trying. . Feeling nervous and excitable. These are not all of the side effects that may occur. If you have questions about side effects, call your doctor. Call your doctor for medical advice about side effects. You may report side effects to your national health agency. How is this drug best taken? Use this drug as ordered by your doctor. Read all information given to you. Follow all instructions closely. . It is given as a shot. . Some products need to be given into the fatty part of the skin. Some products need to be given into a muscle. Talk with the doctor or pharmacist if you are not sure how to use this drug. . If you will be giving yourself the shot, your doctor or nurse will teach you how to give the shot. Wendee Copp your hands before and after use. . Wear gloves while mixing and giving this drug. . Move the site where you give the shot with each shot. . Do not use if the solution is cloudy, leaking, or has particles. . Do not use if solution changes color. . Throw away needles in a needle/sharp disposal box. Do not reuse needles or other items. When the box is full, follow all local rules for getting rid of it. Talk with a doctor or pharmacist if you have any questions. Marland Kitchen Keep using this drug as you have been told by your doctor or other health care provider, even if you feel well. . This drug may affect certain lab tests. Tell all of your health care providers and lab workers that you take this drug. What do I do if I miss a dose? . Call your doctor to find out what to do. How do I store and/or throw out this drug? Marland Kitchen Most of the  time, this drug will be given in a hospital or doctor's office. If stored at home, follow how to store as you were told by the doctor. Marland Kitchen Keep all drugs in a safe place.  Keep all drugs out of the reach of children and pets. . Throw away unused or expired drugs. Do not flush down a toilet or pour down a drain unless you are told to do so. Check with your pharmacist if you have questions about the best way to throw out drugs. There may be drug take-back programs in your area. General drug facts . If your symptoms or health problems do not get better or if they become worse, call your doctor. . Do not share your drugs with others and do not take anyone else's drugs. . Some drugs may have another patient information leaflet. If you have any questions about this drug, please talk with your doctor, nurse, pharmacist, or other health care provider. . If you think there has been an overdose, call your poison control center or get medical care right away. Be ready to tell or show what was taken, how much, and when it happened.

## 2019-01-02 NOTE — Telephone Encounter (Signed)
Spoke with [place] in [employer]. No prior authorization is needed for patient's Medicaid [place] Access for Lupron injection.

## 2019-01-03 ENCOUNTER — Telehealth: Payer: Self-pay | Admitting: Gynecologic Oncology

## 2019-01-03 NOTE — Telephone Encounter (Signed)
Spoke with the patient this morning to discuss lab tests from yesterday, showing persistent anemia with a hemoglobin of 5.7.  I attempted to call the patient multiple times yesterday after her clinic visit but was unable to reach her.  Patient voiced understanding of her lab results and my recommendation for transfusion.  She is scheduled for appointments tomorrow morning starting at 8 to have a type and screen performed as well as to receive 2 units of packed red blood cells.  She is amenable to this and voices confirmation of appointment time.  She also understands that we will likely receive the Lupron injection from the main pharmacy either today or tomorrow and will call her about when she can come in to get this.  All questions answered. KTucker MD

## 2019-01-04 ENCOUNTER — Other Ambulatory Visit: Payer: Self-pay | Admitting: Gynecologic Oncology

## 2019-01-04 ENCOUNTER — Other Ambulatory Visit: Payer: Self-pay

## 2019-01-04 ENCOUNTER — Inpatient Hospital Stay: Payer: Medicaid Other

## 2019-01-04 DIAGNOSIS — D649 Anemia, unspecified: Secondary | ICD-10-CM

## 2019-01-04 DIAGNOSIS — N9489 Other specified conditions associated with female genital organs and menstrual cycle: Secondary | ICD-10-CM

## 2019-01-04 DIAGNOSIS — D250 Submucous leiomyoma of uterus: Secondary | ICD-10-CM | POA: Diagnosis not present

## 2019-01-04 LAB — CBC
HCT: 18.1 % — ABNORMAL LOW (ref 36.0–46.0)
Hemoglobin: 5 g/dL — CL (ref 12.0–15.0)
MCH: 18.9 pg — ABNORMAL LOW (ref 26.0–34.0)
MCHC: 27.6 g/dL — ABNORMAL LOW (ref 30.0–36.0)
MCV: 68.3 fL — ABNORMAL LOW (ref 80.0–100.0)
Platelets: 317 10*3/uL (ref 150–400)
RBC: 2.65 MIL/uL — ABNORMAL LOW (ref 3.87–5.11)
RDW: 26.8 % — ABNORMAL HIGH (ref 11.5–15.5)
WBC: 8.9 10*3/uL (ref 4.0–10.5)
nRBC: 0 % (ref 0.0–0.2)

## 2019-01-04 LAB — ABO/RH: ABO/RH(D): O POS

## 2019-01-04 LAB — PREPARE RBC (CROSSMATCH)

## 2019-01-04 MED ORDER — SODIUM CHLORIDE 0.9% IV SOLUTION
250.0000 mL | Freq: Once | INTRAVENOUS | Status: AC
  Administered 2019-01-04: 10:00:00 250 mL via INTRAVENOUS
  Filled 2019-01-04: qty 250

## 2019-01-04 MED ORDER — LEUPROLIDE ACETATE 3.75 MG IM KIT
3.7500 mg | PACK | Freq: Once | INTRAMUSCULAR | Status: AC
  Administered 2019-01-04: 3.75 mg via INTRAMUSCULAR
  Filled 2019-01-04: qty 3.75

## 2019-01-04 MED ORDER — SODIUM CHLORIDE 0.9% FLUSH
3.0000 mL | INTRAVENOUS | Status: DC | PRN
  Filled 2019-01-04: qty 10

## 2019-01-04 NOTE — Patient Instructions (Addendum)
Blood Transfusion, Adult, Care After This sheet gives you information about how to care for yourself after your procedure. Your doctor may also give you more specific instructions. If you have problems or questions, contact your doctor. Follow these instructions at home:   Take over-the-counter and prescription medicines only as told by your doctor.  Go back to your normal activities as told by your doctor.  Follow instructions from your doctor about how to take care of the area where an IV tube was put into your vein (insertion site). Make sure you: ? Wash your hands with soap and water before you change your bandage (dressing). If there is no soap and water, use hand sanitizer. ? Change your bandage as told by your doctor.  Check your IV insertion site every day for signs of infection. Check for: ? More redness, swelling, or pain. ? More fluid or blood. ? Warmth. ? Pus or a bad smell. Contact a doctor if:  You have more redness, swelling, or pain around the IV insertion site.  You have more fluid or blood coming from the IV insertion site.  Your IV insertion site feels warm to the touch.  You have pus or a bad smell coming from the IV insertion site.  Your pee (urine) turns pink, red, or brown.  You feel weak after doing your normal activities. Get help right away if:  You have signs of a serious allergic or body defense (immune) system reaction, including: ? Itchiness. ? Hives. ? Trouble breathing. ? Anxiety. ? Pain in your chest or lower back. ? Fever, flushing, and chills. ? Fast pulse. ? Rash. ? Watery poop (diarrhea). ? Throwing up (vomiting). ? Dark pee. ? Serious headache. ? Dizziness. ? Stiff neck. ? Yellow color in your face or the white parts of your eyes (jaundice). Summary  After a blood transfusion, return to your normal activities as told by your doctor.  Every day, check for signs of infection where the IV tube was put into your vein.  Some  signs of infection are warm skin, more redness and pain, more fluid or blood, and pus or a bad smell where the needle went in.  Contact your doctor if you feel weak or have any unusual symptoms. This information is not intended to replace advice given to you by your health care provider. Make sure you discuss any questions you have with your health care provider. Document Released: 03/30/2014 Document Revised: 07/14/2017 Document Reviewed: 11/01/2015 Elsevier Patient Education  [place].   Leuprolide injection What is this medicine? LEUPROLIDE (loo PROE lide) is a man-made hormone. It is used to treat the symptoms of prostate cancer. This medicine may also be used to treat children with early onset of puberty. It may be used for other hormonal conditions. This medicine may be used for other purposes; ask your health care provider or pharmacist if you have questions. COMMON BRAND NAME(S): Lupron What should I tell my health care provider before I take this medicine? They need to know if you have any of these conditions:  diabetes  heart disease or previous heart attack  high blood pressure  high cholesterol  pain or difficulty passing urine  spinal cord metastasis  stroke  tobacco smoker  an unusual or allergic reaction to leuprolide, benzyl alcohol, other medicines, foods, dyes, or preservatives  pregnant or trying to get pregnant  breast-feeding How should I use this medicine? This medicine is for injection under the skin or into a  muscle. You will be taught how to prepare and give this medicine. Use exactly as directed. Take your medicine at regular intervals. Do not take your medicine more often than directed. It is important that you put your used needles and syringes in a special sharps container. Do not put them in a trash can. If you do not have a sharps container, call your pharmacist or healthcare provider to get one. A special MedGuide will be given to you  by the pharmacist with each prescription and refill. Be sure to read this information carefully each time. Talk to your pediatrician regarding the use of this medicine in children. While this medicine may be prescribed for children as young as 8 years for selected conditions, precautions do apply. Overdosage: If you think you have taken too much of this medicine contact a poison control center or emergency room at once. NOTE: This medicine is only for you. Do not share this medicine with others. What if I miss a dose? If you miss a dose, take it as soon as you can. If it is almost time for your next dose, take only that dose. Do not take double or extra doses. What may interact with this medicine? Do not take this medicine with any of the following medications:  chasteberry This medicine may also interact with the following medications:  herbal or dietary supplements, like black cohosh or DHEA  female hormones, like estrogens or progestins and birth control pills, patches, rings, or injections  female hormones, like testosterone This list may not describe all possible interactions. Give your health care provider a list of all the medicines, herbs, non-prescription drugs, or dietary supplements you use. Also tell them if you smoke, drink alcohol, or use illegal drugs. Some items may interact with your medicine. What should I watch for while using this medicine? Visit your doctor or health care professional for regular checks on your progress. During the first week, your symptoms may get worse, but then will improve as you continue your treatment. You may get hot flashes, increased bone pain, increased difficulty passing urine, or an aggravation of nerve symptoms. Discuss these effects with your doctor or health care professional, some of them may improve with continued use of this medicine. Female patients may experience a menstrual cycle or spotting during the first 2 months of therapy with this  medicine. If this continues, contact your doctor or health care professional. This medicine may increase blood sugar. Ask your healthcare provider if changes in diet or medicines are needed if you have diabetes. What side effects may I notice from receiving this medicine? Side effects that you should report to your doctor or health care professional as soon as possible:  allergic reactions like skin rash, itching or hives, swelling of the face, lips, or tongue  breathing problems  chest pain  depression or memory disorders  pain in your legs or groin  pain at site where injected  severe headache  signs and symptoms of high blood sugar such as being more thirsty or hungry or having to urinate more than normal. You may also feel very tired or have blurry vision  swelling of the feet and legs  visual changes  vomiting Side effects that usually do not require medical attention (report to your doctor or health care professional if they continue or are bothersome):  breast swelling or tenderness  decrease in sex drive or performance  diarrhea  hot flashes  loss of appetite  muscle, joint, or  bone pains  nausea  redness or irritation at site where injected  skin problems or acne This list may not describe all possible side effects. Call your doctor for medical advice about side effects. You may report side effects to FDA at 1-800-FDA-1088. Where should I keep my medicine? Keep out of the reach of children. Store below 25 degrees C (77 degrees F). Do not freeze. Protect from light. Do not use if it is not clear or if there are particles present. Throw away any unused medicine after the expiration date. NOTE: This sheet is a summary. It may not cover all possible information. If you have questions about this medicine, talk to your doctor, pharmacist, or health care provider.  2020 Elsevier/Gold Standard (2018-01-06 09:52:48)

## 2019-01-04 NOTE — Progress Notes (Signed)
1245 IV removed d/t sore and tender to touch.   1254:  New IV blood restarted.

## 2019-01-05 ENCOUNTER — Ambulatory Visit: Payer: Medicaid Other

## 2019-01-05 LAB — TYPE AND SCREEN
ABO/RH(D): O POS
Antibody Screen: NEGATIVE
Unit division: 0
Unit division: 0

## 2019-01-05 LAB — BPAM RBC
Blood Product Expiration Date: 202011092359
Blood Product Expiration Date: 202011122359
ISSUE DATE / TIME: 202010141003
ISSUE DATE / TIME: 202010141003
Unit Type and Rh: 5100
Unit Type and Rh: 5100

## 2019-01-12 ENCOUNTER — Telehealth: Payer: Self-pay | Admitting: Gynecologic Oncology

## 2019-01-12 NOTE — Telephone Encounter (Addendum)
Called the patient on her cell phone and home phone.  No answer on her cell phone, left message. Home phone number didn't ring.  I called her husband as I expected the patient to already be in the emergency department to get a blood transfusion after our conversation mid-day.  He was at work but reports that she was planning to come for blood tomorrow.  I urged him to call both the patient as well as her dad to come pick her to bring her to the emergency department.  Given the symptoms she was describing earlier, I do not think that it is safe for her to wait until tomorrow to get blood.  Her husband voiced understanding of my concern and said he would call her immediately.  Jeral Pinch MD

## 2019-01-12 NOTE — Telephone Encounter (Signed)
Called the patient to check in after her Lupron injection last week and to answer any questions she might have about surgery.  She reports having significantly increased bleeding for several days after the Lupron injection.  She has not had any bleeding today or yesterday.  She notes now multiple days of having almost a constant headache that does not respond to medication, feeling dizzy, fatigued, and having the energy only to get up to walk to the bathroom.  She wanted to call the clinic to speak to me directly but was not sure what number to call to do this.  Given the late hour, it is not possible to schedule the patient for an appointment at the infusion center to receive a blood transfusion.  I do not think that it is safe for her to wait until tomorrow to receive blood and have recommended that she come to the emergency department at [facility] to receive a transfusion.  Patient was amenable and said she would be here in about an hour.  We discussed that she is likely severely anemic now and that her symptoms are related to her blood loss.  The patient continues to voice being very hesitant and not interested in surgery at this time.  She has had multiple conversations with her husband who also does not want her to proceed with hysterectomy.  I voiced my concern for her safety and I am willing to give the Lupron some more time to help decrease her bleeding.  I reviewed again that I think the Lupron (and for that matter embolization) is unlikely to significantly shrink her prolapsing fibroid.  The patient was receptive to this news and knows that in the near future I may strongly urge definitive surgery for her safety.  After I got off the phone with the patient, I spoke with one of the ED providers to alert them that the patient will be coming.  We will plan to get her scheduled for an iron infusion sometime next week.  Jeral Pinch MD

## 2019-01-13 ENCOUNTER — Emergency Department (HOSPITAL_COMMUNITY): Payer: Medicaid Other

## 2019-01-13 ENCOUNTER — Other Ambulatory Visit: Payer: Self-pay

## 2019-01-13 ENCOUNTER — Other Ambulatory Visit: Payer: Self-pay | Admitting: Gynecologic Oncology

## 2019-01-13 ENCOUNTER — Inpatient Hospital Stay (HOSPITAL_COMMUNITY)
Admission: EM | Admit: 2019-01-13 | Discharge: 2019-01-15 | DRG: 749 | Disposition: A | Discharge status: Home / Self Care | Payer: Medicaid Other | Attending: Internal Medicine | Admitting: Internal Medicine

## 2019-01-13 ENCOUNTER — Encounter (HOSPITAL_COMMUNITY): Payer: Self-pay

## 2019-01-13 ENCOUNTER — Telehealth: Payer: Self-pay | Admitting: *Deleted

## 2019-01-13 ENCOUNTER — Inpatient Hospital Stay (HOSPITAL_COMMUNITY): Payer: Medicaid Other

## 2019-01-13 ENCOUNTER — Inpatient Hospital Stay: Payer: Medicaid Other

## 2019-01-13 DIAGNOSIS — E861 Hypovolemia: Secondary | ICD-10-CM | POA: Diagnosis present

## 2019-01-13 DIAGNOSIS — D649 Anemia, unspecified: Secondary | ICD-10-CM

## 2019-01-13 DIAGNOSIS — Z20828 Contact with and (suspected) exposure to other viral communicable diseases: Secondary | ICD-10-CM | POA: Diagnosis present

## 2019-01-13 DIAGNOSIS — D259 Leiomyoma of uterus, unspecified: Secondary | ICD-10-CM | POA: Diagnosis not present

## 2019-01-13 DIAGNOSIS — Z800 Family history of malignant neoplasm of digestive organs: Secondary | ICD-10-CM

## 2019-01-13 DIAGNOSIS — E876 Hypokalemia: Secondary | ICD-10-CM | POA: Diagnosis not present

## 2019-01-13 DIAGNOSIS — N920 Excessive and frequent menstruation with regular cycle: Secondary | ICD-10-CM | POA: Diagnosis present

## 2019-01-13 DIAGNOSIS — R079 Chest pain, unspecified: Secondary | ICD-10-CM | POA: Diagnosis not present

## 2019-01-13 DIAGNOSIS — N939 Abnormal uterine and vaginal bleeding, unspecified: Secondary | ICD-10-CM | POA: Diagnosis not present

## 2019-01-13 DIAGNOSIS — D62 Acute posthemorrhagic anemia: Secondary | ICD-10-CM | POA: Diagnosis not present

## 2019-01-13 DIAGNOSIS — D251 Intramural leiomyoma of uterus: Principal | ICD-10-CM | POA: Diagnosis present

## 2019-01-13 DIAGNOSIS — R000 Tachycardia, unspecified: Secondary | ICD-10-CM | POA: Insufficient documentation

## 2019-01-13 DIAGNOSIS — F419 Anxiety disorder, unspecified: Secondary | ICD-10-CM | POA: Diagnosis present

## 2019-01-13 DIAGNOSIS — D250 Submucous leiomyoma of uterus: Secondary | ICD-10-CM | POA: Diagnosis present

## 2019-01-13 DIAGNOSIS — R0602 Shortness of breath: Secondary | ICD-10-CM | POA: Diagnosis not present

## 2019-01-13 DIAGNOSIS — Z8249 Family history of ischemic heart disease and other diseases of the circulatory system: Secondary | ICD-10-CM | POA: Diagnosis not present

## 2019-01-13 DIAGNOSIS — K5900 Constipation, unspecified: Secondary | ICD-10-CM | POA: Diagnosis present

## 2019-01-13 DIAGNOSIS — Z79899 Other long term (current) drug therapy: Secondary | ICD-10-CM

## 2019-01-13 DIAGNOSIS — Z801 Family history of malignant neoplasm of trachea, bronchus and lung: Secondary | ICD-10-CM

## 2019-01-13 DIAGNOSIS — Z885 Allergy status to narcotic agent status: Secondary | ICD-10-CM

## 2019-01-13 DIAGNOSIS — Z791 Long term (current) use of non-steroidal anti-inflammatories (NSAID): Secondary | ICD-10-CM

## 2019-01-13 HISTORY — PX: IR US GUIDE VASC ACCESS RIGHT: IMG2390

## 2019-01-13 HISTORY — PX: IR EMBO TUMOR ORGAN ISCHEMIA INFARCT INC GUIDE ROADMAPPING: IMG5449

## 2019-01-13 HISTORY — PX: IR ANGIOGRAM SELECTIVE EACH ADDITIONAL VESSEL: IMG667

## 2019-01-13 HISTORY — PX: IR FLUORO GUIDED NEEDLE PLC ASPIRATION/INJECTION LOC: IMG2395

## 2019-01-13 HISTORY — PX: IR ANGIOGRAM PELVIS SELECTIVE OR SUPRASELECTIVE: IMG661

## 2019-01-13 LAB — BASIC METABOLIC PANEL
Anion gap: 11 (ref 5–15)
BUN: 9 mg/dL (ref 6–20)
CO2: 18 mmol/L — ABNORMAL LOW (ref 22–32)
Calcium: 8.8 mg/dL — ABNORMAL LOW (ref 8.9–10.3)
Chloride: 106 mmol/L (ref 98–111)
Creatinine, Ser: 0.76 mg/dL (ref 0.44–1.00)
GFR calc Af Amer: >60 mL/min (ref >60)
GFR calc non Af Amer: >60 mL/min (ref >60)
Glucose, Bld: 113 mg/dL — ABNORMAL HIGH (ref 70–99)
Potassium: 3.3 mmol/L — ABNORMAL LOW (ref 3.5–5.1)
Sodium: 135 mmol/L (ref 135–145)

## 2019-01-13 LAB — PREPARE RBC (CROSSMATCH)

## 2019-01-13 LAB — CBC
HCT: 11.8 % — ABNORMAL LOW (ref 36.0–46.0)
HCT: 16.5 % — ABNORMAL LOW (ref 36.0–46.0)
Hemoglobin: 3.2 g/dL — CL (ref 12.0–15.0)
Hemoglobin: 5 g/dL — CL (ref 12.0–15.0)
MCH: 20.5 pg — ABNORMAL LOW (ref 26.0–34.0)
MCH: 25 pg — ABNORMAL LOW (ref 26.0–34.0)
MCHC: 27.1 g/dL — ABNORMAL LOW (ref 30.0–36.0)
MCHC: 30.3 g/dL (ref 30.0–36.0)
MCV: 75.6 fL — ABNORMAL LOW (ref 80.0–100.0)
MCV: 82.5 fL (ref 80.0–100.0)
Platelets: 490 10*3/uL — ABNORMAL HIGH (ref 150–400)
Platelets: 318 10*3/uL (ref 150–400)
RBC: 1.56 MIL/uL — ABNORMAL LOW (ref 3.87–5.11)
RBC: 2 MIL/uL — ABNORMAL LOW (ref 3.87–5.11)
RDW: 26.2 % — ABNORMAL HIGH (ref 11.5–15.5)
RDW: 22 % — ABNORMAL HIGH (ref 11.5–15.5)
WBC: 10.9 10*3/uL — ABNORMAL HIGH (ref 4.0–10.5)
WBC: 8 10*3/uL (ref 4.0–10.5)
nRBC: 0.3 % — ABNORMAL HIGH (ref 0.0–0.2)
nRBC: 0 % (ref 0.0–0.2)

## 2019-01-13 LAB — I-STAT BETA HCG BLOOD, ED (MC, WL, AP ONLY): I-stat hCG, quantitative: <5 m[IU]/mL (ref <5)

## 2019-01-13 LAB — HIV ANTIBODY (ROUTINE TESTING W REFLEX): HIV Screen 4th Generation wRfx: NONREACTIVE

## 2019-01-13 LAB — MAGNESIUM: Magnesium: 1.8 mg/dL (ref 1.7–2.4)

## 2019-01-13 LAB — TROPONIN I (HIGH SENSITIVITY)
Troponin I (High Sensitivity): 3 ng/L (ref <18)
Troponin I (High Sensitivity): 2 ng/L (ref <18)

## 2019-01-13 LAB — SARS CORONAVIRUS 2 BY RT PCR (HOSPITAL ORDER, PERFORMED IN ~~LOC~~ HOSPITAL LAB): SARS Coronavirus 2: NEGATIVE

## 2019-01-13 MED ORDER — KETOROLAC TROMETHAMINE 30 MG/ML IJ SOLN
INTRAMUSCULAR | Status: AC
  Administered 2019-01-13: 30 mg via INTRAVENOUS
  Filled 2019-01-13: qty 1

## 2019-01-13 MED ORDER — FENTANYL CITRATE (PF) 100 MCG/2ML IJ SOLN
INTRAMUSCULAR | Status: AC | PRN
  Administered 2019-01-13 (×4): 50 ug via INTRAVENOUS

## 2019-01-13 MED ORDER — IOHEXOL 300 MG/ML  SOLN
100.0000 mL | Freq: Once | INTRAMUSCULAR | Status: AC | PRN
  Administered 2019-01-13: 30 mL via INTRA_ARTERIAL

## 2019-01-13 MED ORDER — DIPHENHYDRAMINE HCL 25 MG PO CAPS
25.0000 mg | ORAL_CAPSULE | Freq: Four times a day (QID) | ORAL | Status: DC | PRN
  Administered 2019-01-14 – 2019-01-15 (×5): 25 mg via ORAL
  Filled 2019-01-13 (×5): qty 1

## 2019-01-13 MED ORDER — MIDAZOLAM HCL 2 MG/2ML IJ SOLN
INTRAMUSCULAR | Status: AC | PRN
  Administered 2019-01-13 (×5): 1 mg via INTRAVENOUS

## 2019-01-13 MED ORDER — SODIUM CHLORIDE 0.9% FLUSH: 3.0000 mL | INTRAVENOUS | Status: DC | PRN

## 2019-01-13 MED ORDER — POTASSIUM CHLORIDE 10 MEQ/100ML IV SOLN
INTRAVENOUS | Status: AC
  Administered 2019-01-13: 10 meq via INTRAVENOUS
  Filled 2019-01-13: qty 100

## 2019-01-13 MED ORDER — LORAZEPAM 2 MG/ML IJ SOLN
0.5000 mg | Freq: Once | INTRAMUSCULAR | Status: AC
  Administered 2019-01-13: 13:00:00 0.5 mg via INTRAVENOUS
  Filled 2019-01-13: qty 1

## 2019-01-13 MED ORDER — NAPROXEN SODIUM 275 MG PO TABS
550.0000 mg | ORAL_TABLET | Freq: Two times a day (BID) | ORAL | Status: DC
  Administered 2019-01-14 – 2019-01-15 (×3): 550 mg via ORAL
  Filled 2019-01-13 (×4): qty 2

## 2019-01-13 MED ORDER — DOCUSATE SODIUM 100 MG PO CAPS
100.0000 mg | ORAL_CAPSULE | Freq: Two times a day (BID) | ORAL | Status: DC
  Administered 2019-01-13 – 2019-01-15 (×4): 100 mg via ORAL
  Filled 2019-01-13 (×4): qty 1

## 2019-01-13 MED ORDER — PROMETHAZINE HCL 25 MG PO TABS: 25.0000 mg | ORAL_TABLET | Freq: Three times a day (TID) | ORAL | Status: DC | PRN

## 2019-01-13 MED ORDER — ROPIVACAINE HCL 5 MG/ML IJ SOLN
INTRAMUSCULAR | Status: AC
  Filled 2019-01-13: qty 30

## 2019-01-13 MED ORDER — FENTANYL CITRATE (PF) 100 MCG/2ML IJ SOLN
INTRAMUSCULAR | Status: AC
  Filled 2019-01-13: qty 2

## 2019-01-13 MED ORDER — OXYCODONE-ACETAMINOPHEN 5-325 MG PO TABS
1.0000 | ORAL_TABLET | ORAL | Status: DC | PRN
  Administered 2019-01-13 – 2019-01-15 (×2): 1 via ORAL
  Filled 2019-01-13: qty 1
  Filled 2019-01-13: qty 2

## 2019-01-13 MED ORDER — SODIUM CHLORIDE 0.9 % IV SOLN
250.0000 mL | INTRAVENOUS | Status: DC | PRN
  Administered 2019-01-14: 250 mL via INTRAVENOUS

## 2019-01-13 MED ORDER — ONDANSETRON HCL 4 MG/2ML IJ SOLN: 4.0000 mg | Freq: Four times a day (QID) | INTRAMUSCULAR | Status: DC | PRN

## 2019-01-13 MED ORDER — LORAZEPAM 1 MG PO TABS: 1.0000 mg | ORAL_TABLET | Freq: Once | ORAL | Status: DC

## 2019-01-13 MED ORDER — POTASSIUM CHLORIDE 10 MEQ/100ML IV SOLN
10.0000 meq | INTRAVENOUS | Status: AC
  Administered 2019-01-13 – 2019-01-14 (×4): 10 meq via INTRAVENOUS
  Filled 2019-01-13: qty 100

## 2019-01-13 MED ORDER — IOHEXOL 300 MG/ML  SOLN
100.0000 mL | Freq: Once | INTRAMUSCULAR | Status: AC | PRN
  Administered 2019-01-13: 58 mL via INTRA_ARTERIAL

## 2019-01-13 MED ORDER — SODIUM CHLORIDE 0.9 % IV SOLN
1.0000 g | Freq: Once | INTRAVENOUS | Status: AC
  Administered 2019-01-13: 1 g via INTRAVENOUS
  Filled 2019-01-13: qty 10

## 2019-01-13 MED ORDER — SODIUM CHLORIDE 0.9 % IV BOLUS
1000.0000 mL | Freq: Once | INTRAVENOUS | Status: AC
  Administered 2019-01-13: 1000 mL via INTRAVENOUS

## 2019-01-13 MED ORDER — LIDOCAINE HCL (PF) 1 % IJ SOLN
INTRAMUSCULAR | Status: AC | PRN
  Administered 2019-01-13: 5 mL

## 2019-01-13 MED ORDER — MIDAZOLAM HCL 2 MG/2ML IJ SOLN
INTRAMUSCULAR | Status: AC
  Filled 2019-01-13: qty 4

## 2019-01-13 MED ORDER — HYDROMORPHONE HCL 1 MG/ML IJ SOLN
1.0000 mg | INTRAMUSCULAR | Status: DC | PRN
  Administered 2019-01-13 – 2019-01-15 (×9): 1 mg via INTRAVENOUS
  Filled 2019-01-13 (×9): qty 1

## 2019-01-13 MED ORDER — MIDAZOLAM HCL 2 MG/2ML IJ SOLN
INTRAMUSCULAR | Status: AC | PRN
  Administered 2019-01-13: 1 mg via INTRAVENOUS

## 2019-01-13 MED ORDER — DEXAMETHASONE 4 MG PO TABS
8.0000 mg | ORAL_TABLET | Freq: Once | ORAL | Status: AC
  Administered 2019-01-13: 8 mg via ORAL
  Filled 2019-01-13: qty 2

## 2019-01-13 MED ORDER — PROMETHAZINE HCL 25 MG RE SUPP: 25.0000 mg | Freq: Three times a day (TID) | RECTAL | Status: DC | PRN

## 2019-01-13 MED ORDER — LIDOCAINE HCL 1 % IJ SOLN
INTRAMUSCULAR | Status: AC
  Filled 2019-01-13: qty 20

## 2019-01-13 MED ORDER — MIDAZOLAM HCL 2 MG/2ML IJ SOLN
INTRAMUSCULAR | Status: AC
  Filled 2019-01-13: qty 2

## 2019-01-13 MED ORDER — PREDNISONE 20 MG PO TABS
20.0000 mg | ORAL_TABLET | Freq: Every day | ORAL | Status: AC
  Administered 2019-01-14: 20 mg via ORAL
  Filled 2019-01-13: qty 1

## 2019-01-13 MED ORDER — SODIUM CHLORIDE 0.9% FLUSH
3.0000 mL | Freq: Two times a day (BID) | INTRAVENOUS | Status: DC
  Administered 2019-01-13 – 2019-01-15 (×3): 3 mL via INTRAVENOUS

## 2019-01-13 MED ORDER — ONDANSETRON HCL 4 MG/2ML IJ SOLN
4.0000 mg | Freq: Once | INTRAMUSCULAR | Status: AC
  Administered 2019-01-13: 4 mg via INTRAVENOUS
  Filled 2019-01-13: qty 2

## 2019-01-13 NOTE — ED Notes (Signed)
CRITICAL VALUE STICKER  CRITICAL VALUE: Hemoglobin 5.0  RECEIVER (on-site recipient of call):  DATE & TIME NOTIFIED: 01/13/19  MESSENGER (representative from lab): Harriette Ohara   MD NOTIFIED:   TIME OF NOTIFICATION:  RESPONSE:

## 2019-01-13 NOTE — Consult Note (Addendum)
GYNECOLOGIC ONCOLOGY CONSULTATION  Date of Service: 01/13/19 Requesting Service: ED Requesting Provider: Dorie Rank, MD Consulting Provider: Jeral Pinch, MD   HISTORY OF PRESENT ILLNESS: Becky Larson is a 38 y.o. woman who is seen in consultation at the request of Pearlie Oyster, PA-C for evaluation of profound and symptomatic anemia secondary to vaginal bleeding with resulting hypovolemia.  I recently saw the patient in clinic for a large uterine fibroid prolapsing through her cervix with acute on chronic symptomatic anemia.  At that clinic visit we discussed treatment options and the patient was quite resistant to the idea of uterine artery dilatation or hysterectomy.  She voiced desire to become pregnant again and concerns about her husband's disapproval of her having a hysterectomy.  At that time, regardless of the intervention, we discussed that I thought it was very unlikely we would find a solution that would allow her to achieve pregnancy in the future.  The patient desired to start with medical management and received a dose of Depo-Lupron last week (1 month dosing).  I called her yesterday to follow-up and she endorsed multiple symptoms of severe anemia.  She had increased vaginal bleeding after the Lupron injection although had stopped bleeding on Wednesday.  I urged her to come into the emergency department yesterday afternoon for evaluation and blood transfusion.  The patient was unable to present due to weakness until this morning.  Upon arrival, the patient voiced chest pain, difficulty breathing, and profound weakness.  She was taken immediately to the emergency department and a hemoglobin was noted to be 3.2.  She is currently receiving blood.  She endorses a headache, heaviness in her chest especially with respiration, and difficulty with even small movements because of weakness.  She has had intermittent nausea as well as emesis for the last several days but has able to keep down  a good amount of fluid.  She notes normal bladder function.  She had a bowel movement last night which she notes seem to be the impetus for her to start bleeding again.  Overnight, she would place 3 large peripads on at a time and would bleed through these in an hour or less.  She became so tired that she was unable to get up to go to the bathroom to change her pad since our husband would bring her several shards that she would put between her legs.  PAST MEDICAL HISTORY: Past Medical History:  Diagnosis Date  . Anemia    due to blood loss  . Dyspnea    due to exertion   . Ectopic pregnancy    Right  . Fibroid uterus     PAST SURGICAL HISTORY: Past Surgical History:  Procedure Laterality Date  . CESAREAN SECTION    . HYSTEROSCOPY W/D&C N/A 01/05/2018   Procedure: DILATATION AND CURETTAGE /HYSTEROSCOPY/MYOMECTOMY/POLYPECTOMY;  Surgeon: Aletha Halim, MD;  Location: [facility];  Service: Gynecology;  Laterality: N/A;  . LAPAROSCOPIC SALPINGOOPHERECTOMY Right    ectopic    OB/GYN HISTORY: OB History  Gravida Para Term Preterm AB Living  7 2 2  0 5 2  SAB TAB Ectopic Multiple Live Births  3 2 0 0 2    # Outcome Date GA Lbr Len/2nd Weight Sex Delivery Anes PTL Lv  7 Term           6 Term           5 SAB           4 SAB  3 SAB           2 TAB           1 TAB            MEDICATIONS: No current facility-administered medications for this encounter.   Current Outpatient Medications:  .  Leuprolide Acetate (LUPRON DEPOT, 14-MONTH, IM), Inject 1 Dose into the muscle every 30 (thirty) days., Disp: , Rfl:  .  naproxen sodium (ALEVE) 220 MG tablet, Take 440 mg by mouth 2 (two) times daily as needed (chest pain)., Disp: , Rfl:  .  ferrous sulfate 325 (65 FE) MG tablet, Take 1 tablet (325 mg total) by mouth daily with breakfast. (Patient not taking: Reported on 01/13/2019), Disp: 30 tablet, Rfl: 3 .  senna (SENOKOT) 8.6 MG TABS tablet, Take 1 tablet (8.6 mg  total) by mouth daily as needed for mild constipation. (Patient not taking: Reported on 01/13/2019), Disp: 30 tablet, Rfl: 2  ALLERGIES: Allergies  Allergen Reactions  . Morphine And Related Itching    respritatory issues as well    FAMILY HISTORY: Family History  Problem Relation Age of Onset  . Hypertension Father   . Hypertension Paternal Grandfather   . Heart attack Paternal Grandfather   . Lung cancer Paternal Grandfather   . Colon cancer Maternal Grandmother   . Lung cancer Paternal Grandmother   . Lung cancer Maternal Grandfather   . Ovarian cancer Neg Hx   . Uterine cancer Neg Hx   . Cervical cancer Neg Hx   . Breast cancer Neg Hx     SOCIAL HISTORY: Social History   Socioeconomic History  . Marital status: Married    Spouse name: Not on file  . Number of children: Not on file  . Years of education: Not on file  . Highest education level: Not on file  Occupational History  . Not on file  Social Needs  . Financial resource strain: Not on file  . Food insecurity    Worry: Not on file    Inability: Not on file  . Transportation needs    Medical: Not on file    Non-medical: Not on file  Tobacco Use  . Smoking status: Never Smoker  . Smokeless tobacco: Never Used  Substance and Sexual Activity  . Alcohol use: Yes    Comment: socially  . Drug use: Yes    Frequency: 7.0 times per week    Types: Marijuana    Comment: uses daily  . Sexual activity: Yes    Birth control/protection: None  Lifestyle  . Physical activity    Days per week: Not on file    Minutes per session: Not on file  . Stress: Not on file  Relationships  . Social Herbalist on phone: Not on file    Gets together: Not on file    Attends religious service: Not on file    Active member of club or organization: Not on file    Attends meetings of clubs or organizations: Not on file    Relationship status: Not on file  . Intimate partner violence    Fear of current or ex  partner: Not on file    Emotionally abused: Not on file    Physically abused: Not on file    Forced sexual activity: Not on file  Other Topics Concern  . Not on file  Social History Narrative   Lives with husband and son. Currently in school for nursing.  REVIEW OF SYSTEMS: Complete 10-system review is negative except for the following: Weakness, fatigue, shortness of breath, chest heaviness, vaginal bleeding.  PHYSICAL EXAM: BP 123/63   Pulse (!) 116   Temp 98.4 F (36.9 C) (Oral)   Resp (!) 23   Ht 5\' 6"  (1.676 m)   Wt 165 lb (74.8 kg)   LMP 01/13/2019   SpO2 100%   BMI 26.63 kg/m  General: Alert, oriented, no acute distress. HEENT: Normocephalic, atraumatic.  Moderate to severe conjunctival pallor. Chest: Clear to auscultation bilaterally. Cardiovascular: Tachycardic, regular rhythm, no murmurs, rubs, or gallops. Abdomen: Normoactive bowel sounds.  Soft, nondistended, nontender to palpation.  No masses or hepatosplenomegaly appreciated.  No evidence of hernia.  No palpable fluid wave.   Extremities: Grossly normal range of motion.  Warm.  No edema bilaterally. Skin: No rashes or lesions. GU: Deferred.  LABORATORY AND RADIOLOGIC DATA: CBC    Component Value Date/Time   WBC 10.9 (H) 01/13/2019 0953   RBC 1.56 (L) 01/13/2019 0953   HGB 3.2 (LL) 01/13/2019 0953   HGB 5.7 (LL) 01/02/2019 1531   HGB 6.1 (LL) 03/17/2018 1641   HCT 11.8 (L) 01/13/2019 0953   HCT 21.2 (L) 03/17/2018 1641   PLT 490 (H) 01/13/2019 0953   PLT 255 01/02/2019 1531   PLT 473 (H) 03/17/2018 1641   MCV 75.6 (L) 01/13/2019 0953   MCV 68 (L) 03/17/2018 1641   MCH 20.5 (L) 01/13/2019 0953   MCHC 27.1 (L) 01/13/2019 0953   RDW 26.2 (H) 01/13/2019 0953   RDW 17.0 (H) 03/17/2018 1641   LYMPHSABS 2.1 12/22/2018 1551   MONOABS 0.4 12/22/2018 1551   EOSABS 0.3 12/22/2018 1551   BASOSABS 0.0 12/22/2018 1551   CMP Latest Ref Rng & Units 01/13/2019 12/22/2018 11/11/2018  Glucose 70 - 99 mg/dL 113(H)  103(H) 89  BUN 6 - 20 mg/dL 9 15 9   Creatinine 0.44 - 1.00 mg/dL 0.76 0.56 0.62  Sodium 135 - 145 mmol/L 135 138 139  Potassium 3.5 - 5.1 mmol/L 3.3(L) 3.3(L) 4.0  Chloride 98 - 111 mmol/L 106 107 104  CO2 22 - 32 mmol/L 18(L) 24 21  Calcium 8.9 - 10.3 mg/dL 8.8(L) 8.5(L) 8.8  Total Protein 6.5 - 8.1 g/dL - 6.4(L) -  Total Bilirubin 0.3 - 1.2 mg/dL - 0.6 -  Alkaline Phos 38 - 126 U/L - 48 -  AST 15 - 41 U/L - 17 -  ALT 0 - 44 U/L - 17 -     ASSESSMENT AND PLAN: Blanchie Crowe is a 38 y.o. woman with severe acute on chronic symptomatic anemia in the setting of profound vaginal bleeding due to a large prolapsing fibroid.  The patient voices good understanding of the severity of her situation.  We discussed again the prolapsing fibroid as the cause of her heavy bleeding and my worry that Lupron will not be sufficient nor fast enough to decrease her bleeding.  I reviewed both uterine artery embolization as well as hysterectomy again.  The patient voiced significant anxiety about both options and became upset during a discussion about possible future hysterectomy in terms of the loss of ability to have more children and her fear that her husband will leave her.  Given her significant anemia and relative instability, I do not recommend proceeding with hysterectomy at this time.  The patient needs to be stabilized and given her hesitance regarding hysterectomy, ideally this would be a discussion we could have when she is feeling better  and possibly of procedure planned in the upcoming weeks if we can stabilize or stop her bleeding.  I have recommended uterine artery embolization today in the acute setting.  Patient ultimately was amenable to this plan.  I spoke with the interventional radiology team who will review her case for possible uterine artery embolization.  Additionally, the patient may benefit from some IV premarin while admitted.  I spoke again with the patient's husband to convey my  concerns for Becky Larson's safety and wellbeing.  With her permission, I updated him on her current status as well as the treatment plan.  He received this information well and asked appropriate questions.  Please keep the patient n.p.o. for interventional radiology procedure.  The above assessment and plan was communicated to the patient's primary team.  Jeral Pinch MD Gynecologic Oncology

## 2019-01-13 NOTE — ED Notes (Signed)
CRITICAL VALUE STICKER  CRITICAL VALUE: Hemoglobin 3.2   RECEIVER (on-site recipient of call): This RN  DATE & TIME NOTIFIED: 01/13/19 1034  MESSENGER (representative from lab): Lupita Raider  MD NOTIFIED: York Cerise PA  TIME OF NOTIFICATION: 1035  RESPONSE: Blood transfusion orders. Will continue to monitor.

## 2019-01-13 NOTE — ED Notes (Signed)
Pt repeatedly refused COVID swab stating "no this will hurt too much you're going to poke my brain, Just let me do it that's what they did last time" This RN informed pt that this procedure must be completed correctly by a healthcare professional. A second RN assisted to help obtain swab but pt remained combative and would not let swab be advanced into nares. This RN made Zigmund Daniel MD aware.

## 2019-01-13 NOTE — Telephone Encounter (Signed)
Patient arrived to the cancer center and checked in for lab appt. Patient started complaining of chest pain. Per Dr. Berline Lopes patient taken to the ER. On the way to the ER patient was asked how many pads she was going through; she stated "Three an hour; I use three at a time and have to change them every hour. I sometimes can't stand/sit long enough to change them, so I go back to bed and my husband bring me several t-shirts to use. I fell worst than when my hemoglobin was 4." Patient stated complaining of SOB on the way to the ER.

## 2019-01-13 NOTE — ED Notes (Signed)
Patient to IR

## 2019-01-13 NOTE — ED Provider Notes (Signed)
[facility] DEPT Provider Note   CSN: [id number] Arrival date & time: 01/13/19  0940     History   Chief Complaint Chief Complaint  Patient presents with  . Chest Pain  . Shortness of Breath    HPI Becky Larson is a 38 y.o. female.     The history is provided by the patient and medical records. No language interpreter was used.  Chest Pain Associated symptoms: shortness of breath   Associated symptoms: no cough   Shortness of Breath Associated symptoms: chest pain   Associated symptoms: no cough    Becky Larson is a 38 y.o. female  with a PMH as listed below including uterine fibroids and anemia who presents to the Emergency Department by recommendation of gynecology for chest pain, shortness of breath and persistent vaginal bleeding.  Patient states that she has been going through 3 pads an hour for the last several days.  She started having chest pain and shortness of breath 2 days ago which has been persistently getting worse.  No fever, chills, cough, congestion.   Past Medical History:  Diagnosis Date  . Anemia    due to blood loss  . Dyspnea    due to exertion   . Ectopic pregnancy    Right  . Fibroid uterus     Patient Active Problem List   Diagnosis Date Noted  . Anemia 01/13/2019  . Hypokalemia 01/13/2019  . Sinus tachycardia 01/13/2019  . Abnormal uterine bleeding due to leiomyoma of uterus   . Intramural and submucous leiomyoma of uterus 01/02/2019  . Iron deficiency anemia due to chronic blood loss 01/02/2019  . Endometrial mass 12/22/2018  . Symptomatic anemia 12/22/2018  . Anemia associated with acute blood loss 03/18/2018  . Menorrhagia 03/18/2018  . 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 CESAREAN SECTION    . HYSTEROSCOPY W/D&C N/A 01/05/2018   Procedure: DILATATION AND CURETTAGE /HYSTEROSCOPY/MYOMECTOMY/POLYPECTOMY;  Surgeon: Aletha Halim, MD;  Location: [facility];  Service: Gynecology;  Laterality: N/A;  . LAPAROSCOPIC SALPINGOOPHERECTOMY Right    ectopic     OB History    Gravida  7   Para  2   Term  2   Preterm  0   AB  5   Living  2     SAB  3   TAB  2   Ectopic  0   Multiple  0   Live Births  2            Home Medications    Prior to Admission medications   Medication Sig Start Date End Date Taking? Authorizing Provider  Leuprolide Acetate (LUPRON DEPOT, 69-MONTH, IM) Inject 1 Dose into the muscle every 30 (thirty) days.   Yes [provider]  naproxen sodium (ALEVE) 220 MG tablet Take 440 mg by mouth 2 (two) times daily as needed (chest pain).   Yes [provider]  ferrous sulfate 325 (65 FE) MG tablet Take 1 tablet (325 mg total) by mouth daily with breakfast. Patient not taking: Reported on 01/13/2019 01/02/19   Lafonda Mosses, MD  senna (SENOKOT) 8.6 MG TABS tablet Take 1 tablet (8.6 mg total) by mouth daily as needed for mild constipation. Patient not taking: Reported on 01/13/2019 01/02/19   Lafonda Mosses, MD    Family History Family History  Problem Relation Age of Onset  . Hypertension Father   .  Hypertension Paternal Grandfather   . Heart attack Paternal Grandfather   . Lung cancer Paternal Grandfather   . Colon cancer Maternal Grandmother   . Lung cancer Paternal Grandmother   . Lung cancer Maternal Grandfather   . Ovarian cancer Neg Hx   . Uterine cancer Neg Hx   . Cervical cancer Neg Hx   . Breast cancer Neg Hx     Social History Social History   Tobacco Use  . Smoking status: Never Smoker  . Smokeless tobacco: Never Used  Substance Use Topics  . Alcohol use: Yes    Comment: socially  . Drug use: Yes    Frequency: 7.0 times per week    Types: Marijuana    Comment: uses daily     Allergies   Morphine and related   Review of Systems Review of Systems  Respiratory: Positive for shortness of breath.  Negative for cough.   Cardiovascular: Positive for chest pain.  All other systems reviewed and are negative.    Physical Exam Updated Vital Signs BP 123/63   Pulse (!) 116   Temp 98.4 F (36.9 C) (Oral)   Resp (!) 23   Ht 5\' 6"  (1.676 m)   Wt 74.8 kg   LMP 01/13/2019   SpO2 100%   BMI 26.63 kg/m   Physical Exam Vitals signs and nursing note reviewed.  Constitutional:      General: She is not in acute distress.    Appearance: She is well-developed.  HENT:     Head: Normocephalic and atraumatic.  Neck:     Musculoskeletal: Neck supple.  Cardiovascular:     Heart sounds: Normal heart sounds. No murmur.     Comments: Tachycardic, but regular. Pulmonary:     Effort: Pulmonary effort is normal. No respiratory distress.     Breath sounds: Normal breath sounds.  Abdominal:     General: There is no distension.     Palpations: Abdomen is soft.     Tenderness: There is no abdominal tenderness.  Skin:    General: Skin is warm and dry.     Coloration: Skin is pale.  Neurological:     Mental Status: She is alert and oriented to person, place, and time.      ED Treatments / Results  Labs (all labs ordered are listed, but only abnormal results are displayed) Labs Reviewed  BASIC METABOLIC PANEL - Abnormal; Notable for the following components:      Result Value   Potassium 3.3 (*)    CO2 18 (*)    Glucose, Bld 113 (*)    Calcium 8.8 (*)    All other components within normal limits  CBC - Abnormal; Notable for the following components:   WBC 10.9 (*)    RBC 1.56 (*)    Hemoglobin 3.2 (*)    HCT 11.8 (*)    MCV 75.6 (*)    MCH 20.5 (*)    MCHC 27.1 (*)    RDW 26.2 (*)    Platelets 490 (*)    nRBC 0.3 (*)    All other components within normal limits  SARS CORONAVIRUS 2 BY RT PCR (HOSPITAL ORDER, [facility])  HIV ANTIBODY (ROUTINE TESTING W REFLEX)  CBC  MAGNESIUM  I-STAT BETA HCG BLOOD, ED ([facility], [facility], [facility] ONLY)  PREPARE RBC (CROSSMATCH)   TYPE AND SCREEN  TROPONIN I (HIGH SENSITIVITY)  TROPONIN I (HIGH SENSITIVITY)    EKG EKG Interpretation  Date/Time:  Friday January 13 2019 11:21:13 EDT Ventricular Rate:  116 PR Interval:    QRS Duration: 95 QT Interval:  343 QTC Calculation: 477 R Axis:   12 Text Interpretation:  Sinus tachycardia Borderline T wave abnormalities No significant change since last tracing Confirmed by Dorie Rank [phone]) on 01/13/2019 11:24:24 AM   Radiology Dg Chest 2 View  Result Date: 01/13/2019 CLINICAL DATA:  Chest pain and shortness of breath EXAM: CHEST - 2 VIEW COMPARISON:  02/09/2017 FINDINGS: Cardiac shadow is stable. The lungs are well aerated bilaterally. No focal infiltrate or sizable effusion is seen. No acute bony abnormality is noted. IMPRESSION: No active cardiopulmonary disease. Electronically Signed   By: Inez Catalina M.D.   On: 01/13/2019 10:39    Procedures Procedures (including critical care time)  CRITICAL CARE Performed by: Ozella Almond Maryem Shuffler   Total critical care time: 35 minutes  Critical care time was exclusive of separately billable procedures and treating other patients.  Critical care was necessary to treat or prevent imminent or life-threatening deterioration.  Critical care was time spent personally by me on the following activities: development of treatment plan with patient and/or surrogate as well as nursing, discussions with consultants, evaluation of patient's response to treatment, examination of patient, obtaining history from patient or surrogate, ordering and performing treatments and interventions, ordering and review of laboratory studies, ordering and review of radiographic studies, pulse oximetry and re-evaluation of patient's condition.   Medications Ordered in ED Medications  potassium chloride 10 mEq in 100 mL IVPB (has no administration in time range)  LORazepam (ATIVAN) injection 0.5 mg (has no administration in time range)  LORazepam  (ATIVAN) tablet 1 mg (has no administration in time range)  sodium chloride 0.9 % bolus 1,000 mL (0 mLs Intravenous Stopped 01/13/19 1304)  ondansetron (ZOFRAN) injection 4 mg (4 mg Intravenous Given 01/13/19 1102)     Initial Impression / Assessment and Plan / ED Course  I have reviewed the triage vital signs and the nursing notes.  Pertinent labs & imaging results that were available during my care of the patient were reviewed by me and considered in my medical decision making (see chart for details).       Becky Larson is a 38 y.o. female who presents to ED by recommendation of GYN for chest pain, shortness of breath in the setting of symptomatic anemia 2/2 vaginal bleeding. Patient with known hx of uterine fibroid followed by GYN leading to anemia requiring blood transfusions in the past. On exam today, she is quite tachycardic. BP's running a little soft 105-116/50-70. Started on IV fluids upon arrival to ED and ordered blood anticipating patient will require transfusion. Hgb 3.2. Blood transfusion started. Troponin of 3 and EKG without acute ischemic changes. CXR clear. Suspect her chest pain and shortness of breath is 2/2 her profound anemia. Discussed patient with GYN/ONC, Dr. Berline Lopes, who has evaluated patient. Recommends medicine admission and speak with IR about embolization today. Keep NPO in event procedure is done. Spoke with IR APP and order placed for eval today. She will discuss with attending regarding possibility of procedure. Discussed with hospitalist who will admit.   Patient discussed with Dr. Tomi Bamberger who agrees with treatment plan.    Final Clinical Impressions(s) / ED Diagnoses   Final diagnoses:  Symptomatic anemia    ED Discharge Orders    None       Amogh Komatsu, Ozella Almond, PA-C 01/13/19 1304    Dorie Rank, MD 01/15/19 1206

## 2019-01-13 NOTE — ED Notes (Signed)
Jaime, PA at bedside.  

## 2019-01-13 NOTE — Procedures (Signed)
Interventional Radiology Procedure Note  Procedure:  1.) Superior hypogastric nerve block 2.) Bilateral [place]  Complications: None  Estimated Blood Loss: None  Recommendations: - Being admitted to hospitalist - Pain control regimen ordered - ADAT  Signed,  Criselda Peaches, MD

## 2019-01-13 NOTE — ED Notes (Signed)
Patient requested this RN call patients husband.   Attempted to call patients husbands number in chart. No answer.

## 2019-01-13 NOTE — H&P (Signed)
History and Physical    Becky Larson [id number] DOB: 1980/10/31 DOA: 01/13/2019  PCP: Patient, No Pcp Per Patient coming from: Home  Chief Complaint: Shortness of breath and chest pain with bleeding fibroids  HPI: Becky Larson is a 38 y.o. female with medical history significant of bleeding fibroids, ectopic pregnancy, anemia had Lupron shot last week.  She had severe bleeding as to where she was changing 3 pads every hour.  She also has been complaining of chest pain shortness of breath.  She denies fever chills cough abdominal pain diarrhea.  She had nausea and 3 episodes of vomiting while in the ER.  She has 2 healthy children boys, she want to have girl and she and her husband are trying to get pregnant so she could have a girl.  Her husband does not want her to undergo hysterectomy.  She reported her husband gets upset with her if she mentions hysterectomy.  Covid pending  ED Course: Patient was started with blood transfusion.  She was found to have a hemoglobin of 3.2.  White count 10.9 platelets 490 sodium 135 potassium 3.3 BUN 9 creatinine 0.76.  High-sensitivity troponin is 3.  Vital signs 04/14/1960, tachycardic at 116 100% on room air afebrile 98.4 respiration 23.  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  Past Medical History:  Diagnosis Date  . Anemia    due to blood loss  . Dyspnea    due to exertion   . Ectopic pregnancy    Right  . Fibroid uterus     Past Surgical History:  Procedure Laterality Date  . CESAREAN SECTION    . HYSTEROSCOPY W/D&C N/A 01/05/2018   Procedure: DILATATION AND CURETTAGE /HYSTEROSCOPY/MYOMECTOMY/POLYPECTOMY;  Surgeon: Aletha Halim, MD;  Location: [facility];  Service: Gynecology;  Laterality: N/A;  . LAPAROSCOPIC SALPINGOOPHERECTOMY Right    ectopic    Social History    Socioeconomic History  . Marital status: Married    Spouse name: Not on file  . Number of children: Not on file  . Years of education: Not on file  . Highest education level: Not on file  Occupational History  . Not on file  Social Needs  . Financial resource strain: Not on file  . Food insecurity    Worry: Not on file    Inability: Not on file  . Transportation needs    Medical: Not on file    Non-medical: Not on file  Tobacco Use  . Smoking status: Never Smoker  . Smokeless tobacco: Never Used  Substance and Sexual Activity  . Alcohol use: Yes    Comment: socially  . Drug use: Yes    Frequency: 7.0 times per week    Types: Marijuana    Comment: uses daily  . Sexual activity: Yes    Birth control/protection: None  Lifestyle  . Physical activity    Days per week: Not on file    Minutes per session: Not on file  . Stress: Not on file  Relationships  . Social Herbalist on phone: Not on file    Gets together: Not on file    Attends religious service: Not on file    Active member of club or organization: Not on file    Attends meetings of clubs or organizations: Not on file    Relationship  status: Not on file  . Intimate partner violence    Fear of current or ex partner: Not on file    Emotionally abused: Not on file    Physically abused: Not on file    Forced sexual activity: Not on file  Other Topics Concern  . Not on file  Social History Narrative   Lives with husband and son. Currently in school for nursing.    Allergies  Allergen Reactions  . Morphine And Related Itching    respritatory issues as well    Family History  Problem Relation Age of Onset  . Hypertension Father   . Hypertension Paternal Grandfather   . Heart attack Paternal Grandfather   . Lung cancer Paternal Grandfather   . Colon cancer Maternal Grandmother   . Lung cancer Paternal Grandmother   . Lung cancer Maternal Grandfather   . Ovarian cancer Neg Hx   . Uterine cancer  Neg Hx   . Cervical cancer Neg Hx   . Breast cancer Neg Hx     Prior to Admission medications   Medication Sig Start Date End Date Taking? Authorizing Provider  Leuprolide Acetate (LUPRON DEPOT, 28-MONTH, IM) Inject 1 Dose into the muscle every 30 (thirty) days.   Yes [provider]  naproxen sodium (ALEVE) 220 MG tablet Take 440 mg by mouth 2 (two) times daily as needed (chest pain).   Yes [provider]  ferrous sulfate 325 (65 FE) MG tablet Take 1 tablet (325 mg total) by mouth daily with breakfast. Patient not taking: Reported on 01/13/2019 01/02/19   Lafonda Mosses, MD  senna (SENOKOT) 8.6 MG TABS tablet Take 1 tablet (8.6 mg total) by mouth daily as needed for mild constipation. Patient not taking: Reported on 01/13/2019 01/02/19   Lafonda Mosses, MD    Physical Exam: Vitals:   01/13/19 1130 01/13/19 1145 01/13/19 1200 01/13/19 1215  BP: 114/62 116/73 121/60 123/63  Pulse: (!) 106 (!) 120 (!) 107 (!) 116  Resp: (!) 25 20 17  (!) 23  Temp:      TempSrc:      SpO2: 92% 100% 100% 100%  Weight:      Height:         . General:  Appears anxious tearful scared  . eyes: PERRL, EOMI, severe pallor . ENT:  grossly normal hearing, lips & tongue, mucous membranes dry . Neck:  no LAD, masses or thyromegaly . Cardiovascular:  RRR, no m/r/g. No LE edema.  Tachycardic . Respiratory:  CTA bilaterally, no w/r/r. Normal respiratory effort. . Abdomen:  soft, ntnd, NABS . Skin:  no rash or induration seen on limited exam . Musculoskeletal:  grossly normal tone BUE/BLE, good ROM, no bony abnormality . Psychiatric:  grossly normal mood and affect, speech fluent and appropriate, AOx3 . Neurologic:  CN 2-12 grossly intact, moves all extremities in coordinated fashion, sensation intact  Labs on Admission: I have personally reviewed following labs and imaging studies  CBC: Recent Labs  Lab 01/13/19 0953  WBC 10.9*  HGB 3.2*  HCT 11.8*  MCV 75.6*  PLT 490*    Basic Metabolic Panel: Recent Labs  Lab 01/13/19 0953  NA 135  K 3.3*  CL 106  CO2 18*  GLUCOSE 113*  BUN 9  CREATININE 0.76  CALCIUM 8.8*   GFR: Estimated Creatinine Clearance: 99.6 mL/min (by C-G formula based on SCr of 0.76 mg/dL). Liver Function Tests: No results for input(s): AST, ALT, ALKPHOS, BILITOT, PROT, ALBUMIN in the  last 168 hours. No results for input(s): LIPASE, AMYLASE in the last 168 hours. No results for input(s): AMMONIA in the last 168 hours. Coagulation Profile: No results for input(s): INR, PROTIME in the last 168 hours. Cardiac Enzymes: No results for input(s): CKTOTAL, CKMB, CKMBINDEX, TROPONINI in the last 168 hours. BNP (last 3 results) No results for input(s): PROBNP in the last 8760 hours. HbA1C: No results for input(s): HGBA1C in the last 72 hours. CBG: No results for input(s): GLUCAP in the last 168 hours. Lipid Profile: No results for input(s): CHOL, HDL, LDLCALC, TRIG, CHOLHDL, LDLDIRECT in the last 72 hours. Thyroid Function Tests: No results for input(s): TSH, T4TOTAL, FREET4, T3FREE, THYROIDAB in the last 72 hours. Anemia Panel: No results for input(s): VITAMINB12, FOLATE, FERRITIN, TIBC, IRON, RETICCTPCT in the last 72 hours. Urine analysis:    Component Value Date/Time   COLORURINE YELLOW 11/13/2017 [place] 11/13/2017 0955   LABSPEC >1.030 (H) 11/13/2017 0955   PHURINE 6.0 11/13/2017 0955   GLUCOSEU NEGATIVE 11/13/2017 0955   HGBUR MODERATE (A) 11/13/2017 0955   BILIRUBINUR NEGATIVE 11/13/2017 0955   KETONESUR NEGATIVE 11/13/2017 0955   PROTEINUR NEGATIVE 11/13/2017 0955   NITRITE NEGATIVE 11/13/2017 0955   LEUKOCYTESUR NEGATIVE 11/13/2017 0955    Creatinine Clearance: Estimated Creatinine Clearance: 99.6 mL/min (by C-G formula based on SCr of 0.76 mg/dL).  Sepsis Labs: @LABRCNTIP (procalcitonin:4,lacticidven:4) )No results found for this or any previous visit (from the past 240 hour(s)).   Radiological  Exams on Admission: Dg Chest 2 View  Result Date: 01/13/2019 CLINICAL DATA:  Chest pain and shortness of breath EXAM: CHEST - 2 VIEW COMPARISON:  02/09/2017 FINDINGS: Cardiac shadow is stable. The lungs are well aerated bilaterally. No focal infiltrate or sizable effusion is seen. No acute bony abnormality is noted. IMPRESSION: No active cardiopulmonary disease. Electronically Signed   By: Inez Catalina M.D.   On: 01/13/2019 10:39    EKG: Independently reviewed tachycardia no acute ST-T wave changes  Assessment/Plan Active Problems:   Anemia   #1 severe anemia secondary to bleeding uterine fibroids.  Patient agreed to have uterine artery embolization. N.p.o. after midnight. Dr. Berline Lopes to follow GYN  Transfuse 2 units of packed RBC. Discussed with her husband Beverely Low that it is important she has a hysterectomy to avoid further bleed.  He said he will talk to his wife about hysterectomy.  #2 chest pain and shortness of breath secondary to severe anemia cardiac enzymes have been negative EKG no acute changes.    #3 sinus tach secondary to #1 should improve as the anemia corrects.    #4 hypokalemia replete and recheck check magnesium Severity of Illness: The appropriate patient status for this patient is INPATIENT. Inpatient status is judged to be reasonable and necessary in order to provide the required intensity of service to ensure the patient's safety. The patient's presenting symptoms, physical exam findings, and initial radiographic and laboratory data in the context of their chronic comorbidities is felt to place them at high risk for further clinical deterioration. Furthermore, it is not anticipated that the patient will be medically stable for discharge from the hospital within 2 midnights of admission. The following factors support the patient status of inpatient.   " The patient's presenting symptoms include chest pain shortness of breath generalized weakness unable to sit or stand "  The worrisome physical exam findings include severe pallor tachycardia " The initial radiographic and laboratory data are worrisome because of hemoglobin of 3.2 " The chronic  co-morbidities include bleeding fibroids   * I certify that at the point of admission it is my clinical judgment that the patient will require inpatient hospital care spanning beyond 2 midnights from the point of admission due to high intensity of service, high risk for further deterioration and high frequency of surveillance required.*  Estimated body mass index is 26.63 kg/m as calculated from the following:   Height as of this encounter: 5\' 6"  (1.676 m).   Weight as of this encounter: 74.8 kg.   DVT prophylaxis: SCD Code Status: Full code Family Communication: Discussed with her husband Beverely Low Disposition Plan: Pending clinical improvement Consults called: ED physician discussed with GYN Dr. Berline Lopes Admission status: Inpatient   Georgette Shell MD [facility]  If 7PM-7AM, please contact night-coverage [email or web address] Password [email or web address]  01/13/2019, 12:26 PM

## 2019-01-13 NOTE — ED Notes (Signed)
Pt off floor at Xray

## 2019-01-13 NOTE — Telephone Encounter (Signed)
Per Melissa APP attempted to call the patient to bring her in for a lab draw to check her hgb. LMOM to call the office back. Tried to call the home phone and phone rang then disconnects.

## 2019-01-13 NOTE — ED Triage Notes (Signed)
Patient comes from cancer center with c/o shortness of breath and chest pain that started 01/11/19. Patient reports history of low hgb that gave her same symptoms and required multiple blood transfusions. Patient has no cancer, being treated at cancer center for fibroids.

## 2019-01-13 NOTE — Progress Notes (Signed)
As patient did not present to the emergency department for blood transfusion overnight, our office called her this morning to stress the importance of coming in.  We were able to obtain an appointment at the infusion center for blood transfusion midday.  We have recommended that she come in immediately to have a CBC and type and screen drawn.  Upon presentation, the patient reported being too weak to stand at registration and was endorsing chest pain when she breathes for the last several days.  Given this, we have recommended that she go immediately to the emergency department for evaluation and transfusion there.  I called one of the ED providers to alert him that the patient was coming in provide her history.  Valarie Cones MD

## 2019-01-13 NOTE — Telephone Encounter (Signed)
Patient called back and stated "I just didn't want to move last night, that's why I didn't come. I feel much worse this morning. Does Dr. Berline Lopes want me to go to the ER or come there." Per Dr. Berline Lopes, patient to come here for lab appt and possible transfusion. Explained to the patient and scheduled appt for 9am

## 2019-01-13 NOTE — Consult Note (Signed)
Chief Complaint: Patient was seen in consultation today for symptomatic uterine fibroids  Referring Physician(s): Dr. Berline Lopes  Supervising Physician: Jacqulynn Cadet  Patient Status: [facility] - ED  History of Present Illness: Becky Larson is a 38 y.o. female with past medical history of large uterine fibroid prolapsing through the cervix with associated blood loss anemia.  She was recently evaluated at her OBGYN office for same at which time it was recommended that patient undergo hysterectomy for definitive management, however patient and husband were hopeful for future pregnancy despite low likelihood of achieving this due to her extensive fibroids. She was given Lupron injection with some improvement in blood flow, however continued with profound weakness and shortness of breath at home.  She was referred tot he [facility] ED.   Patient assessed at bedside this afternoon.  She endorses shortness of breath and weakness at home.  Her hemoglobin on admission was 3.2 and improved to 5.5 after infusion.  Patient tearfully explains that she has had persistent bleeding for the past 1 year requiring daily pad use, multiple pad changes per day. She states that her husband would like to have another baby and has not been able to bring herself to agree to hysterectomy knowing this would prevent any future possibility of more children. She agrees that intervention is warranted given her current degree of symptoms and ongoing bleeding.  She has been presented with all available options and after consideration and discussion with Dr. Berline Lopes and her husband, she elects to proceed with uterine artery embolization urgently for profound anemia.   She has been NPO today.   Past Medical History:  Diagnosis Date  . Anemia    due to blood loss  . Dyspnea    due to exertion   . Ectopic pregnancy    Right  . Fibroid uterus     Past Surgical History:  Procedure Laterality Date  . CESAREAN SECTION    .  HYSTEROSCOPY W/D&C N/A 01/05/2018   Procedure: DILATATION AND CURETTAGE /HYSTEROSCOPY/MYOMECTOMY/POLYPECTOMY;  Surgeon: Aletha Halim, MD;  Location: [facility];  Service: Gynecology;  Laterality: N/A;  . LAPAROSCOPIC SALPINGOOPHERECTOMY Right    ectopic    Allergies: Morphine and related  Medications: Prior to Admission medications   Medication Sig Start Date End Date Taking? Authorizing Provider  Leuprolide Acetate (LUPRON DEPOT, 76-MONTH, IM) Inject 1 Dose into the muscle every 30 (thirty) days.   Yes [provider]  naproxen sodium (ALEVE) 220 MG tablet Take 440 mg by mouth 2 (two) times daily as needed (chest pain).   Yes [provider]  ferrous sulfate 325 (65 FE) MG tablet Take 1 tablet (325 mg total) by mouth daily with breakfast. Patient not taking: Reported on 01/13/2019 01/02/19   Lafonda Mosses, MD  senna (SENOKOT) 8.6 MG TABS tablet Take 1 tablet (8.6 mg total) by mouth daily as needed for mild constipation. Patient not taking: Reported on 01/13/2019 01/02/19   Lafonda Mosses, MD     Family History  Problem Relation Age of Onset  . Hypertension Father   . Hypertension Paternal Grandfather   . Heart attack Paternal Grandfather   . Lung cancer Paternal Grandfather   . Colon cancer Maternal Grandmother   . Lung cancer Paternal Grandmother   . Lung cancer Maternal Grandfather   . Ovarian cancer Neg Hx   . Uterine cancer Neg Hx   . Cervical cancer Neg Hx   . Breast cancer Neg Hx  Social History   Socioeconomic History  . Marital status: Married    Spouse name: Not on file  . Number of children: Not on file  . Years of education: Not on file  . Highest education level: Not on file  Occupational History  . Not on file  Social Needs  . Financial resource strain: Not on file  . Food insecurity    Worry: Not on file    Inability: Not on file  . Transportation needs    Medical: Not on file    Non-medical: Not on  file  Tobacco Use  . Smoking status: Never Smoker  . Smokeless tobacco: Never Used  Substance and Sexual Activity  . Alcohol use: Yes    Comment: socially  . Drug use: Yes    Frequency: 7.0 times per week    Types: Marijuana    Comment: uses daily  . Sexual activity: Yes    Birth control/protection: None  Lifestyle  . Physical activity    Days per week: Not on file    Minutes per session: Not on file  . Stress: Not on file  Relationships  . Social Herbalist on phone: Not on file    Gets together: Not on file    Attends religious service: Not on file    Active member of club or organization: Not on file    Attends meetings of clubs or organizations: Not on file    Relationship status: Not on file  Other Topics Concern  . Not on file  Social History Narrative   Lives with husband and son. Currently in school for nursing.     Review of Systems: A 12 point ROS discussed and pertinent positives are indicated in the HPI above.  All other systems are negative.  Review of Systems  Constitutional: Positive for fatigue. Negative for fever.  Respiratory: Positive for shortness of breath. Negative for cough.   Cardiovascular: Positive for chest pain.  Gastrointestinal: Negative for abdominal pain, constipation, diarrhea and nausea.  Genitourinary: Positive for menstrual problem and vaginal bleeding. Negative for dysuria.  Musculoskeletal: Negative for back pain.  Psychiatric/Behavioral: Negative for behavioral problems and confusion.    Vital Signs: BP 110/63   Pulse (!) 116   Temp 98.4 F (36.9 C) (Oral)   Resp 17   Ht 5\' 6"  (1.676 m)   Wt 165 lb (74.8 kg)   LMP 01/13/2019   SpO2 100%   BMI 26.63 kg/m   Physical Exam Vitals signs and nursing note reviewed.  Constitutional:      Appearance: She is well-developed.  HENT:     Mouth/Throat:     Mouth: Mucous membranes are moist.     Pharynx: Oropharynx is clear.  Cardiovascular:     Rate and Rhythm:  Normal rate and regular rhythm.  Pulmonary:     Effort: Pulmonary effort is normal. No respiratory distress.     Breath sounds: Normal breath sounds.  Abdominal:     General: Abdomen is flat.     Palpations: Abdomen is soft.  Skin:    General: Skin is warm and dry.  Neurological:     General: No focal deficit present.     Mental Status: She is alert and oriented to person, place, and time.  Psychiatric:        Mood and Affect: Mood normal.        Behavior: Behavior normal.        Thought Content: Thought content  normal.        Judgment: Judgment normal.      MD Evaluation Airway: WNL Heart: WNL Abdomen: WNL Chest/ Lungs: WNL ASA  Classification: 3 Mallampati/Airway Score: One   Imaging: Dg Chest 2 View  Result Date: 01/13/2019 CLINICAL DATA:  Chest pain and shortness of breath EXAM: CHEST - 2 VIEW COMPARISON:  02/09/2017 FINDINGS: Cardiac shadow is stable. The lungs are well aerated bilaterally. No focal infiltrate or sizable effusion is seen. No acute bony abnormality is noted. IMPRESSION: No active cardiopulmonary disease. Electronically Signed   By: Inez Catalina M.D.   On: 01/13/2019 10:39    Labs:  CBC: Recent Labs    01/02/19 1531 01/04/19 0858 01/13/19 0953 01/13/19 1224  WBC 6.7 8.9 10.9* 8.0  HGB 5.7* 5.0* 3.2* 5.0*  HCT 20.4* 18.1* 11.8* 16.5*  PLT 255 317 490* 318    COAGS: No results for input(s): INR, APTT in the last 8760 hours.  BMP: Recent Labs    11/11/18 1100 12/22/18 1551 01/13/19 0953  NA 139 138 135  K 4.0 3.3* 3.3*  CL 104 107 106  CO2 21 24 18*  GLUCOSE 89 103* 113*  BUN 9 15 9   CALCIUM 8.8 8.5* 8.8*  CREATININE 0.62 0.56 0.76  GFRNONAA 116 >60 >60  GFRAA 133 >60 >60    LIVER FUNCTION TESTS: Recent Labs    12/22/18 1551  BILITOT 0.6  AST 17  ALT 17  ALKPHOS 48  PROT 6.4*  ALBUMIN 3.7    TUMOR MARKERS: No results for input(s): AFPTM, CEA, CA199, CHROMGRNA in the last 8760 hours.  Assessment and Plan:  Syptomatic uterine fibroids Profound anemia, Hgb 3.2 Patient with long-standing history of fibroids, now with severe anemia requiring blood transfusions.  No improvement desipte Lupron injection 1 week ago.  She presents to the ED with weakness.  She has been presented with options for fibroid treatment including conservative management (which has failed given current presentation), uterine artery embolization, and hysterectomy. After discussion with her OBGYN and husband she elects to proceed with uterine artery embolization.  Infertility is well-documented and previously discussed between Dr. Berline Lopes and patient.  Discussed expectations for pain and pain management post-procedure.  Discussed expected recovery to occur over time with gradual improvement.  Plan to proceed with uterine artery embolization with inferior hypogastric nerve block.  SCr 0.76 Fibroid previously biopsied 11/11/18 and found to be benign.   The Risks and benefits of embolization were discussed with the patient including, but not limited to bleeding, infection, vascular injury, post operative pain, or contrast induced renal failure.  This procedure involves the use of X-rays and because of the nature of the planned procedure, it is possible that we will have prolonged use of X-ray fluoroscopy.  Potential radiation risks to you include (but are not limited to) the following: - A slightly elevated risk for cancer several years later in life. This risk is typically less than 0.5% percent. This risk is low in comparison to the normal incidence of human cancer, which is 33% for women and 50% for men according to the [employer]. - Radiation induced injury can include skin redness, resembling a rash, tissue breakdown / ulcers and hair loss (which can be temporary or permanent).  The likelihood of either of these occurring depends on the difficulty of the procedure and whether you are sensitive to radiation due to  previous procedures, disease, or genetic conditions.  IF your procedure requires a prolonged use of radiation,  you will be notified and given written instructions for further action. It is your responsibility to monitor the irradiated area for the 2 weeks following the procedure and to notify your physician if you are concerned that you have suffered a radiation induced injury.   All of the patient's questions were answered, patient is agreeable to proceed. Consent signed and in chart  Thank you for this interesting consult.  I greatly enjoyed meeting Jaylani Roussell and look forward to participating in their care.  A copy of this report was sent to the requesting provider on this date.  Electronically Signed: Docia Barrier, PA 01/13/2019, 2:06 PM   I spent a total of 40 Minutes    in face to face in clinical consultation, greater than 50% of which was counseling/coordinating care for symptomatic uterine fibroids.

## 2019-01-13 NOTE — Progress Notes (Signed)
Unable to start IV K due to inadequate IV access. IV team consult placed to obtain IV for runs of K.

## 2019-01-14 DIAGNOSIS — D250 Submucous leiomyoma of uterus: Secondary | ICD-10-CM

## 2019-01-14 DIAGNOSIS — D62 Acute posthemorrhagic anemia: Secondary | ICD-10-CM | POA: Diagnosis present

## 2019-01-14 DIAGNOSIS — E876 Hypokalemia: Secondary | ICD-10-CM

## 2019-01-14 DIAGNOSIS — D251 Intramural leiomyoma of uterus: Principal | ICD-10-CM

## 2019-01-14 LAB — RETICULOCYTES
Immature Retic Fract: 4.6 % (ref 2.3–15.9)
RBC.: 2.39 MIL/uL — ABNORMAL LOW (ref 3.87–5.11)
Retic Count, Absolute: 0 10*3/uL — ABNORMAL LOW (ref 19.0–186.0)
Retic Ct Pct: 3 % (ref 0.4–3.1)

## 2019-01-14 LAB — COMPREHENSIVE METABOLIC PANEL
ALT: 16 U/L (ref 0–44)
AST: 14 U/L — ABNORMAL LOW (ref 15–41)
Albumin: 3.5 g/dL (ref 3.5–5.0)
Alkaline Phosphatase: 34 U/L — ABNORMAL LOW (ref 38–126)
Anion gap: 8 (ref 5–15)
BUN: 5 mg/dL — ABNORMAL LOW (ref 6–20)
CO2: 21 mmol/L — ABNORMAL LOW (ref 22–32)
Calcium: 8.5 mg/dL — ABNORMAL LOW (ref 8.9–10.3)
Chloride: 107 mmol/L (ref 98–111)
Creatinine, Ser: 0.53 mg/dL (ref 0.44–1.00)
GFR calc Af Amer: >60 mL/min (ref >60)
GFR calc non Af Amer: >60 mL/min (ref >60)
Glucose, Bld: 125 mg/dL — ABNORMAL HIGH (ref 70–99)
Potassium: 4 mmol/L (ref 3.5–5.1)
Sodium: 136 mmol/L (ref 135–145)
Total Bilirubin: 1.2 mg/dL (ref 0.3–1.2)
Total Protein: 6 g/dL — ABNORMAL LOW (ref 6.5–8.1)

## 2019-01-14 LAB — CBC
HCT: 19.6 % — ABNORMAL LOW (ref 36.0–46.0)
Hemoglobin: 6.1 g/dL — CL (ref 12.0–15.0)
MCH: 25.3 pg — ABNORMAL LOW (ref 26.0–34.0)
MCHC: 31.1 g/dL (ref 30.0–36.0)
MCV: 81.3 fL (ref 80.0–100.0)
Platelets: 360 10*3/uL (ref 150–400)
RBC: 2.41 MIL/uL — ABNORMAL LOW (ref 3.87–5.11)
RDW: 20.1 % — ABNORMAL HIGH (ref 11.5–15.5)
WBC: 9.3 10*3/uL (ref 4.0–10.5)
nRBC: 0.4 % — ABNORMAL HIGH (ref 0.0–0.2)

## 2019-01-14 LAB — IRON AND TIBC
Iron: 20 ug/dL — ABNORMAL LOW (ref 28–170)
Saturation Ratios: 4 % — ABNORMAL LOW (ref 10.4–31.8)
TIBC: 452 ug/dL — ABNORMAL HIGH (ref 250–450)
UIBC: 432 ug/dL

## 2019-01-14 LAB — PREPARE RBC (CROSSMATCH)

## 2019-01-14 LAB — VITAMIN B12: Vitamin B-12: 851 pg/mL (ref 180–914)

## 2019-01-14 LAB — HEMOGLOBIN AND HEMATOCRIT, BLOOD
HCT: 27.3 % — ABNORMAL LOW (ref 36.0–46.0)
Hemoglobin: 8.6 g/dL — ABNORMAL LOW (ref 12.0–15.0)

## 2019-01-14 LAB — FOLATE: Folate: 13.2 ng/mL (ref >5.9)

## 2019-01-14 LAB — FERRITIN: Ferritin: 4 ng/mL — ABNORMAL LOW (ref 11–307)

## 2019-01-14 MED ORDER — CHLORHEXIDINE GLUCONATE CLOTH 2 % EX PADS
6.0000 | MEDICATED_PAD | Freq: Every day | CUTANEOUS | Status: DC
  Administered 2019-01-14 – 2019-01-15 (×2): 6 via TOPICAL

## 2019-01-14 MED ORDER — SODIUM CHLORIDE 0.9% IV SOLUTION
Freq: Once | INTRAVENOUS | Status: AC
  Administered 2019-01-14: 10:00:00 via INTRAVENOUS

## 2019-01-14 MED ORDER — POTASSIUM CHLORIDE 10 MEQ/100ML IV SOLN
INTRAVENOUS | Status: AC
  Administered 2019-01-14: 03:00:00 10 meq via INTRAVENOUS
  Filled 2019-01-14: qty 100

## 2019-01-14 MED ORDER — POTASSIUM CHLORIDE 10 MEQ/100ML IV SOLN
INTRAVENOUS | Status: AC
  Administered 2019-01-14: 06:00:00 10 meq
  Filled 2019-01-14: qty 100

## 2019-01-14 MED ORDER — SODIUM CHLORIDE 0.9 % IV SOLN
510.0000 mg | Freq: Once | INTRAVENOUS | Status: AC
  Administered 2019-01-14: 510 mg via INTRAVENOUS
  Filled 2019-01-14: qty 17

## 2019-01-14 NOTE — Progress Notes (Signed)
[facility]                                                                              Patient Demographics  Becky Larson, is a 38 y.o. female, DOB - 1980/08/16, PD:[id number]  Admit date - 01/13/2019   Admitting Physician Georgette Shell, MD  Outpatient Primary MD for the patient is Patient, No Pcp Per  Outpatient specialists:   LOS - 1  days   Medical records reviewed and are as summarized below:    Chief Complaint  Patient presents with   Chest Pain   Shortness of Breath       Brief summary   Patient is a 38 year old female with history of bleeding fibroids, ectopic pregnancy, chronic anemia had Lupron shot last week.  Patient presented with severe bleeding to the point that she was changing 3 pads every hour.  Patient also reported chest pain, shortness of breath.  Patient had nausea and 3 episodes of vomiting in ED. In ED, patient was found to have hemoglobin of 3.2, potassium 3.3, creatinine 0.76.  Tachycardia 116 Patient was started on blood transfusion and OB/GYN was consulted.  COVID-19 test negative   Assessment & Plan    Principal Problem:   Acute blood loss anemia, symptomatic, severe secondary to bleeding uterine fibroids -Patient presented with hemoglobin of 3.2, status post 2 units packed RBC transfusion, hemoglobin still 6.1, will give another 2 units packed RBCs.  Recheck H&H after transfusion. -Anemia profile showed significant iron deficiency, Fe 20, ferritin 4, percent saturation ratio 4 -After packed RBC transfusion will give 1 dose of IV Feraheme -Patient reports that she is not taking oral iron supplements due to constipation -Appreciate OB/GYN recommendations, underwent uterine artery embolization yesterday, bleeding improving -If H&H is stable tomorrow, no significant bleeding, likely DC home and plan for surgery next week.  Patient and her husband to discuss hysterectomy.   Active Problems:   Menorrhagia secondary to  intramural and submucous leiomyoma of uterus, prolapsing fibroid -As #1, OB/GYN following, plan for surgery outpatient next week   Iron deficiency anemia -Anemia panel consistent with severe iron deficiency anemia, will give 1 dose of IV Feraheme   Hypokalemia -Resolved after  replacement  Code Status: Full code DVT Prophylaxis: SCDs Family Communication: Discussed all imaging results, lab results, explained to the patient   Disposition Plan: Possible DC home in a.m. if H&H stable  Time Spent in minutes 35 minutes  Procedures:  Uterine artery embolization on 10/23  Consultants:   Interventional radiology OB/GYN  Antimicrobials:   Anti-infectives (From admission, onward)   Start     Dose/Rate Route Frequency Ordered Stop   01/13/19 1545  cefTRIAXone (ROCEPHIN) 1 g in sodium chloride 0.9 % 100 mL IVPB     1 g 200 mL/hr over 30 Minutes Intravenous  Once 01/13/19 1544 01/13/19 1702         Medications  Scheduled Meds:  Chlorhexidine Gluconate Cloth  6 each Topical Daily   docusate sodium  100 mg Oral BID   LORazepam  1 mg Oral Once   naproxen sodium  550 mg Oral BID WC  sodium chloride flush  3 mL Intravenous Q12H   Continuous Infusions:  sodium chloride     PRN Meds:.sodium chloride, diphenhydrAMINE, HYDROmorphone (DILAUDID) injection, ondansetron (ZOFRAN) IV, oxyCODONE-acetaminophen, promethazine **OR** promethazine, sodium chloride flush      Subjective:   Becky Larson was seen and examined today.  Still bleeding however states that has significantly improved after the procedure yesterday.  No chest pain or shortness of breath, dizziness.  Has not ambulated yet.  Patient denies abdominal pain, N/V/D/C, new weakness, numbess, tingling. No acute events overnight.    Objective:   Vitals:   01/13/19 2108 01/14/19 0426 01/14/19 0919 01/14/19 0957  BP: 118/71 114/65 109/70 (!) 103/52  Pulse: 97 74 78 75  Resp: 16 18 18 18   Temp: 98.3 F (36.8 C) 98.5  F (36.9 C) 98.2 F (36.8 C) 98.4 F (36.9 C)  TempSrc: Oral Oral Oral Oral  SpO2: 99% 100% 100% 100%  Weight:      Height:        Intake/Output Summary (Last 24 hours) at 01/14/2019 1120 Last data filed at 01/14/2019 0542 Gross per 24 hour  Intake 2435.93 ml  Output 1200 ml  Net 1235.93 ml     Wt Readings from Last 3 Encounters:  01/13/19 74.8 kg  01/02/19 74.7 kg  12/22/18 74.8 kg     Exam  General: Alert and oriented x 3, NAD  Eyes: Pale conjunctiva  HEENT:  Atraumatic, normocephalic  Cardiovascular: S1 S2 auscultated, no murmurs, RRR  Respiratory: Clear to auscultation bilaterally, no wheezing, rales or rhonchi  Gastrointestinal: Soft, nontender, nondistended, + bowel sounds  Ext: no pedal edema bilaterally  Neuro: No new deficits  Musculoskeletal: No digital cyanosis, clubbing  Skin: No rashes  Psych: Normal affect and demeanor, alert and oriented x3    Data Reviewed:  I have personally reviewed following labs and imaging studies  Micro Results Recent Results (from the past 240 hour(s))  SARS Coronavirus 2 by RT PCR (hospital order, performed in [place] hospital lab) Nasopharyngeal Nasopharyngeal Swab     Status: None   Collection Time: 01/13/19 12:11 PM   Specimen: Nasopharyngeal Swab  Result Value Ref Range Status   SARS Coronavirus 2 NEGATIVE NEGATIVE Final    Comment: (NOTE) If result is NEGATIVE SARS-CoV-2 target nucleic acids are NOT DETECTED. The SARS-CoV-2 RNA is generally detectable in upper and lower  respiratory specimens during the acute phase of infection. The lowest  concentration of SARS-CoV-2 viral copies this assay can detect is 250  copies / mL. A negative result does not preclude SARS-CoV-2 infection  and should not be used as the sole basis for treatment or other  patient management decisions.  A negative result may occur with  improper specimen collection / handling, submission of specimen other  than nasopharyngeal  swab, presence of viral mutation(s) within the  areas targeted by this assay, and inadequate number of viral copies  (<250 copies / mL). A negative result must be combined with clinical  observations, patient history, and epidemiological information. If result is POSITIVE SARS-CoV-2 target nucleic acids are DETECTED. The SARS-CoV-2 RNA is generally detectable in upper and lower  respiratory specimens dur ing the acute phase of infection.  Positive  results are indicative of active infection with SARS-CoV-2.  Clinical  correlation with patient history and other diagnostic information is  necessary to determine patient infection status.  Positive results do  not rule out bacterial infection or co-infection with other viruses. If result is PRESUMPTIVE POSTIVE  SARS-CoV-2 nucleic acids MAY BE PRESENT.   A presumptive positive result was obtained on the submitted specimen  and confirmed on repeat testing.  While 2019 novel coronavirus  (SARS-CoV-2) nucleic acids may be present in the submitted sample  additional confirmatory testing may be necessary for epidemiological  and / or clinical management purposes  to differentiate between  SARS-CoV-2 and other Sarbecovirus currently known to infect humans.  If clinically indicated additional testing with an alternate test  methodology [phone]) is advised. The SARS-CoV-2 RNA is generally  detectable in upper and lower respiratory sp ecimens during the acute  phase of infection. The expected result is Negative. Fact Sheet for Patients:  StrictlyIdeas.no Fact Sheet for Healthcare Providers: BankingDealers.co.za This test is not yet approved or cleared by the [place] FDA and has been authorized for detection and/or diagnosis of SARS-CoV-2 by FDA under an Emergency Use Authorization (EUA).  This EUA will remain in effect (meaning this test can be used) for the duration of the COVID-19 declaration  under Section 564(b)(1) of the Act, 21 U.S.C. section 360bbb-3(b)(1), unless the authorization is terminated or revoked sooner. Performed at [facility], [place] [address], [place], [place] [address]     Radiology Reports Dg Chest 2 View  Result Date: 01/13/2019 CLINICAL DATA:  Chest pain and shortness of breath EXAM: CHEST - 2 VIEW COMPARISON:  02/09/2017 FINDINGS: Cardiac shadow is stable. The lungs are well aerated bilaterally. No focal infiltrate or sizable effusion is seen. No acute bony abnormality is noted. IMPRESSION: No active cardiopulmonary disease. Electronically Signed   By: Inez Catalina M.D.   On: 01/13/2019 10:39   Ir Angiogram Pelvis Selective Or Supraselective  Result Date: 01/13/2019 INDICATION: 38 year old female with uterine fibroids complicated by severe prolonged bleeding. She is currently admitted with symptomatic anemia. Her hemoglobin is less than 4 and she requires a multi unit transfusion. She has been highly resistant to hysterectomy. She presents now for superior hypogastric nerve block and bilateral uterine artery embolization in an effort to prevent further life-threatening blood-loss. EXAM: IR ULTRASOUND GUIDANCE VASC ACCESS RIGHT; IR FLUORO GUIDE NEEDLE PLACEMENT /BIOPSY; PELVIC SELECTIVE ARTERIOGRAPHY; IR EMBO TUMOR ORGAN ISCHEMIA INFARCT INC GUIDE ROADMAPPING; ADDITIONAL ARTERIOGRAPHY 1. Superior hypogastric nerve block under fluoroscopic guidance 2. Vascular access, right common femoral artery 3. Catheterization of the left internal iliac artery with arteriogram 4. Catheterization of the left uterine artery with arteriogram 5. Particle embolization of the left uterine artery to near stasis 6. Catheterization of the right internal iliac artery with arteriogram 7. Catheterization of the right uterine artery with arteriogram 8. Particle embolization of the right uterine artery to near stasis MEDICATIONS: 1 g Rocephin. The antibiotic was administered  within 1 hour of the procedure. 30 mg Toradol also administered IV during the procedure. ANESTHESIA/SEDATION: Moderate (conscious) sedation was employed during this procedure. A total of Versed 6 mg and Fentanyl 200 mcg was administered intravenously. Moderate Sedation Time: 61 minutes. The patient's level of consciousness and vital signs were monitored continuously by radiology nursing throughout the procedure under my direct supervision. CONTRAST:  58mL OMNIPAQUE IOHEXOL 300 MG/ML SOLN, 49mL OMNIPAQUE IOHEXOL 300 MG/ML SOLN FLUOROSCOPY TIME:  Fluoroscopy Time: 13 minutes 36 seconds (1135 mGy). COMPLICATIONS: None immediate. PROCEDURE: Informed consent was obtained from the patient following explanation of the procedure, risks, benefits and alternatives. The patient understands, agrees and consents for the procedure. All questions were addressed. A time out was performed prior to the initiation of the procedure. Maximal barrier sterile technique  utilized including caps, mask, sterile gowns, sterile gloves, large sterile drape, hand hygiene, and Betadine prep. The right common femoral artery was interrogated with ultrasound and found to be widely patent. An image was obtained and stored for the medical record. Local anesthesia was attained by infiltration with 1% lidocaine. A small dermatotomy was made. Under real-time sonographic guidance, the vessel was punctured with a 21 gauge micropuncture needle. Using standard technique, the initial micro needle was exchanged over a 0.018 micro wire for a transitional 4 [place] micro sheath. The micro sheath was then exchanged over a 0.035 wire for a 5 French vascular sheath. A C2 cobra catheter was advanced over a Bentson wire up in over the aortic bifurcation and into the left internal iliac artery. The catheter was flushed and capped. With the catheter defining the aortic bifurcation and iliac arteries, attention was next turned toward performing the superior hypogastric  nerve block. The image intensifier was angled in till the L5 vertebral body could be viewed en face. Local anesthesia was attained by infiltration with 1% lidocaine. A 22 gauge 15 cm Chiba needle was then advanced under real-time fluoroscopic guidance onto the anterior surface of the L5 vertebral body. A small amount of contrast was injected in the AP and lateral projections confirming that the needle tip was in the retroperitoneal pre-spinal space. There is no evidence of venous extravasation. Next, a total of 20 mL ropivacaine was slowly injected while maintaining forward pressure on the needle. Vital signs were carefully monitored. There is no evidence of intravascular injection. The patient tolerated the injection well. The needle was removed. Attention was again turned to the embolization portion of the procedure. An arteriogram was performed from the catheter within the left internal iliac artery. A hypertrophic uterine artery is readily identified. A renegade hi Flo microcatheter was successfully navigated over a Fathom 16 wire into the horizontal segment of the left uterine artery. Arteriography was performed confirming catheter position. Particle embolization was then performed using 1 vial of 500-700 micron embospheres and 2.5 vials of 700-900 micron embospheres. Post embolization contrast injection demonstrates an appropriate angiographic endpoint of near stasis. The microcatheter was removed. The 5 French C2 cobra catheter was then formed into a Waltman loop in used to select the ipsilateral right internal iliac artery. Arteriography was performed. Again, there is a hypertrophic uterine artery. The microcatheter was reintroduced over the Fathom wire and advanced into the horizontal segment of the right uterine artery. Arteriography was performed confirming microcatheter position. Particle embolization was then performed using 1 vial of 500-700 micron embospheres and 2.25 vials of 700-900 micron  embospheres. Post embolization contrast injection demonstrates an appropriate angiographic endpoint of near stasis. The catheters were removed. A limited right common femoral arteriogram was then performed through the sheath confirming common femoral arterial access. Hemostasis was attained with the assistance of a 6 French Angio-Seal device. IMPRESSION: 1. Successful fluoroscopically guided superior hypogastric nerve block. 2. Successful bilateral uterine artery embolization. Signed, Criselda Peaches, MD, [facility] Radiology Electronically Signed   By: Jacqulynn Cadet M.D.   On: 01/13/2019 16:55   Ir Angiogram Pelvis Selective Or Supraselective  Result Date: 01/13/2019 INDICATION: 38 year old female with uterine fibroids complicated by severe prolonged bleeding. She is currently admitted with symptomatic anemia. Her hemoglobin is less than 4 and she requires a multi unit transfusion. She has been highly resistant to hysterectomy. She presents now for superior hypogastric nerve block and bilateral uterine artery embolization in an effort  to prevent further life-threatening blood-loss. EXAM: IR ULTRASOUND GUIDANCE VASC ACCESS RIGHT; IR FLUORO GUIDE NEEDLE PLACEMENT /BIOPSY; PELVIC SELECTIVE ARTERIOGRAPHY; IR EMBO TUMOR ORGAN ISCHEMIA INFARCT INC GUIDE ROADMAPPING; ADDITIONAL ARTERIOGRAPHY 1. Superior hypogastric nerve block under fluoroscopic guidance 2. Vascular access, right common femoral artery 3. Catheterization of the left internal iliac artery with arteriogram 4. Catheterization of the left uterine artery with arteriogram 5. Particle embolization of the left uterine artery to near stasis 6. Catheterization of the right internal iliac artery with arteriogram 7. Catheterization of the right uterine artery with arteriogram 8. Particle embolization of the right uterine artery to near stasis MEDICATIONS: 1 g Rocephin. The antibiotic was administered  within 1 hour of the procedure. 30 mg Toradol also administered IV during the procedure. ANESTHESIA/SEDATION: Moderate (conscious) sedation was employed during this procedure. A total of Versed 6 mg and Fentanyl 200 mcg was administered intravenously. Moderate Sedation Time: 61 minutes. The patient's level of consciousness and vital signs were monitored continuously by radiology nursing throughout the procedure under my direct supervision. CONTRAST:  60mL OMNIPAQUE IOHEXOL 300 MG/ML SOLN, 53mL OMNIPAQUE IOHEXOL 300 MG/ML SOLN FLUOROSCOPY TIME:  Fluoroscopy Time: 13 minutes 36 seconds (1135 mGy). COMPLICATIONS: None immediate. PROCEDURE: Informed consent was obtained from the patient following explanation of the procedure, risks, benefits and alternatives. The patient understands, agrees and consents for the procedure. All questions were addressed. A time out was performed prior to the initiation of the procedure. Maximal barrier sterile technique utilized including caps, mask, sterile gowns, sterile gloves, large sterile drape, hand hygiene, and Betadine prep. The right common femoral artery was interrogated with ultrasound and found to be widely patent. An image was obtained and stored for the medical record. Local anesthesia was attained by infiltration with 1% lidocaine. A small dermatotomy was made. Under real-time sonographic guidance, the vessel was punctured with a 21 gauge micropuncture needle. Using standard technique, the initial micro needle was exchanged over a 0.018 micro wire for a transitional 4 [place] micro sheath. The micro sheath was then exchanged over a 0.035 wire for a 5 French vascular sheath. A C2 cobra catheter was advanced over a Bentson wire up in over the aortic bifurcation and into the left internal iliac artery. The catheter was flushed and capped. With the catheter defining the aortic bifurcation and iliac arteries, attention was next turned toward performing the superior hypogastric  nerve block. The image intensifier was angled in till the L5 vertebral body could be viewed en face. Local anesthesia was attained by infiltration with 1% lidocaine. A 22 gauge 15 cm Chiba needle was then advanced under real-time fluoroscopic guidance onto the anterior surface of the L5 vertebral body. A small amount of contrast was injected in the AP and lateral projections confirming that the needle tip was in the retroperitoneal pre-spinal space. There is no evidence of venous extravasation. Next, a total of 20 mL ropivacaine was slowly injected while maintaining forward pressure on the needle. Vital signs were carefully monitored. There is no evidence of intravascular injection. The patient tolerated the injection well. The needle was removed. Attention was again turned to the embolization portion of the procedure. An arteriogram was performed from the catheter within the left internal iliac artery. A hypertrophic uterine artery is readily identified. A renegade hi Flo microcatheter was successfully navigated over a Fathom 16 wire into the horizontal segment of the left uterine artery. Arteriography was performed confirming catheter position. Particle embolization was then performed using 1 vial of 500-700 micron embospheres and  2.5 vials of 700-900 micron embospheres. Post embolization contrast injection demonstrates an appropriate angiographic endpoint of near stasis. The microcatheter was removed. The 5 French C2 cobra catheter was then formed into a Waltman loop in used to select the ipsilateral right internal iliac artery. Arteriography was performed. Again, there is a hypertrophic uterine artery. The microcatheter was reintroduced over the Fathom wire and advanced into the horizontal segment of the right uterine artery. Arteriography was performed confirming microcatheter position. Particle embolization was then performed using 1 vial of 500-700 micron embospheres and 2.25 vials of 700-900 micron  embospheres. Post embolization contrast injection demonstrates an appropriate angiographic endpoint of near stasis. The catheters were removed. A limited right common femoral arteriogram was then performed through the sheath confirming common femoral arterial access. Hemostasis was attained with the assistance of a 6 French Angio-Seal device. IMPRESSION: 1. Successful fluoroscopically guided superior hypogastric nerve block. 2. Successful bilateral uterine artery embolization. Signed, Criselda Peaches, MD, [facility] Radiology Electronically Signed   By: Jacqulynn Cadet M.D.   On: 01/13/2019 16:55   Ir Angiogram Selective Each Additional Vessel  Result Date: 01/13/2019 INDICATION: 38 year old female with uterine fibroids complicated by severe prolonged bleeding. She is currently admitted with symptomatic anemia. Her hemoglobin is less than 4 and she requires a multi unit transfusion. She has been highly resistant to hysterectomy. She presents now for superior hypogastric nerve block and bilateral uterine artery embolization in an effort to prevent further life-threatening blood-loss. EXAM: IR ULTRASOUND GUIDANCE VASC ACCESS RIGHT; IR FLUORO GUIDE NEEDLE PLACEMENT /BIOPSY; PELVIC SELECTIVE ARTERIOGRAPHY; IR EMBO TUMOR ORGAN ISCHEMIA INFARCT INC GUIDE ROADMAPPING; ADDITIONAL ARTERIOGRAPHY 1. Superior hypogastric nerve block under fluoroscopic guidance 2. Vascular access, right common femoral artery 3. Catheterization of the left internal iliac artery with arteriogram 4. Catheterization of the left uterine artery with arteriogram 5. Particle embolization of the left uterine artery to near stasis 6. Catheterization of the right internal iliac artery with arteriogram 7. Catheterization of the right uterine artery with arteriogram 8. Particle embolization of the right uterine artery to near stasis MEDICATIONS: 1 g Rocephin. The antibiotic was administered  within 1 hour of the procedure. 30 mg Toradol also administered IV during the procedure. ANESTHESIA/SEDATION: Moderate (conscious) sedation was employed during this procedure. A total of Versed 6 mg and Fentanyl 200 mcg was administered intravenously. Moderate Sedation Time: 61 minutes. The patient's level of consciousness and vital signs were monitored continuously by radiology nursing throughout the procedure under my direct supervision. CONTRAST:  34mL OMNIPAQUE IOHEXOL 300 MG/ML SOLN, 58mL OMNIPAQUE IOHEXOL 300 MG/ML SOLN FLUOROSCOPY TIME:  Fluoroscopy Time: 13 minutes 36 seconds (1135 mGy). COMPLICATIONS: None immediate. PROCEDURE: Informed consent was obtained from the patient following explanation of the procedure, risks, benefits and alternatives. The patient understands, agrees and consents for the procedure. All questions were addressed. A time out was performed prior to the initiation of the procedure. Maximal barrier sterile technique utilized including caps, mask, sterile gowns, sterile gloves, large sterile drape, hand hygiene, and Betadine prep. The right common femoral artery was interrogated with ultrasound and found to be widely patent. An image was obtained and stored for the medical record. Local anesthesia was attained by infiltration with 1% lidocaine. A small dermatotomy was made. Under real-time sonographic guidance, the vessel was punctured with a 21 gauge micropuncture needle. Using standard technique, the initial micro needle was exchanged over a 0.018 micro wire for a transitional 4 [place] micro sheath. The micro sheath was  then exchanged over a 0.035 wire for a 5 [place] vascular sheath. A C2 cobra catheter was advanced over a Bentson wire up in over the aortic bifurcation and into the left internal iliac artery. The catheter was flushed and capped. With the catheter defining the aortic bifurcation and iliac arteries, attention was next turned toward performing the superior hypogastric  nerve block. The image intensifier was angled in till the L5 vertebral body could be viewed en face. Local anesthesia was attained by infiltration with 1% lidocaine. A 22 gauge 15 cm Chiba needle was then advanced under real-time fluoroscopic guidance onto the anterior surface of the L5 vertebral body. A small amount of contrast was injected in the AP and lateral projections confirming that the needle tip was in the retroperitoneal pre-spinal space. There is no evidence of venous extravasation. Next, a total of 20 mL ropivacaine was slowly injected while maintaining forward pressure on the needle. Vital signs were carefully monitored. There is no evidence of intravascular injection. The patient tolerated the injection well. The needle was removed. Attention was again turned to the embolization portion of the procedure. An arteriogram was performed from the catheter within the left internal iliac artery. A hypertrophic uterine artery is readily identified. A renegade hi Flo microcatheter was successfully navigated over a Fathom 16 wire into the horizontal segment of the left uterine artery. Arteriography was performed confirming catheter position. Particle embolization was then performed using 1 vial of 500-700 micron embospheres and 2.5 vials of 700-900 micron embospheres. Post embolization contrast injection demonstrates an appropriate angiographic endpoint of near stasis. The microcatheter was removed. The 5 French C2 cobra catheter was then formed into a Waltman loop in used to select the ipsilateral right internal iliac artery. Arteriography was performed. Again, there is a hypertrophic uterine artery. The microcatheter was reintroduced over the Fathom wire and advanced into the horizontal segment of the right uterine artery. Arteriography was performed confirming microcatheter position. Particle embolization was then performed using 1 vial of 500-700 micron embospheres and 2.25 vials of 700-900 micron  embospheres. Post embolization contrast injection demonstrates an appropriate angiographic endpoint of near stasis. The catheters were removed. A limited right common femoral arteriogram was then performed through the sheath confirming common femoral arterial access. Hemostasis was attained with the assistance of a 6 French Angio-Seal device. IMPRESSION: 1. Successful fluoroscopically guided superior hypogastric nerve block. 2. Successful bilateral uterine artery embolization. Signed, Criselda Peaches, MD, [place] [facility] Radiology Electronically Signed   By: Jacqulynn Cadet M.D.   On: 01/13/2019 16:55   Ir Angiogram Selective Each Additional Vessel  Result Date: 01/13/2019 INDICATION: 38 year old female with uterine fibroids complicated by severe prolonged bleeding. She is currently admitted with symptomatic anemia. Her hemoglobin is less than 4 and she requires a multi unit transfusion. She has been highly resistant to hysterectomy. She presents now for superior hypogastric nerve block and bilateral uterine artery embolization in an effort to prevent further life-threatening blood-loss. EXAM: IR ULTRASOUND GUIDANCE VASC ACCESS RIGHT; IR FLUORO GUIDE NEEDLE PLACEMENT /BIOPSY; PELVIC SELECTIVE ARTERIOGRAPHY; IR EMBO TUMOR ORGAN ISCHEMIA INFARCT INC GUIDE ROADMAPPING; ADDITIONAL ARTERIOGRAPHY 1. Superior hypogastric nerve block under fluoroscopic guidance 2. Vascular access, right common femoral artery 3. Catheterization of the left internal iliac artery with arteriogram 4. Catheterization of the left uterine artery with arteriogram 5. Particle embolization of the left uterine artery to near stasis 6. Catheterization of the right internal iliac artery with arteriogram 7. Catheterization of the right uterine artery  with arteriogram 8. Particle embolization of the right uterine artery to near stasis MEDICATIONS: 1 g Rocephin. The antibiotic was administered  within 1 hour of the procedure. 30 mg Toradol also administered IV during the procedure. ANESTHESIA/SEDATION: Moderate (conscious) sedation was employed during this procedure. A total of Versed 6 mg and Fentanyl 200 mcg was administered intravenously. Moderate Sedation Time: 61 minutes. The patient's level of consciousness and vital signs were monitored continuously by radiology nursing throughout the procedure under my direct supervision. CONTRAST:  50mL OMNIPAQUE IOHEXOL 300 MG/ML SOLN, 61mL OMNIPAQUE IOHEXOL 300 MG/ML SOLN FLUOROSCOPY TIME:  Fluoroscopy Time: 13 minutes 36 seconds (1135 mGy). COMPLICATIONS: None immediate. PROCEDURE: Informed consent was obtained from the patient following explanation of the procedure, risks, benefits and alternatives. The patient understands, agrees and consents for the procedure. All questions were addressed. A time out was performed prior to the initiation of the procedure. Maximal barrier sterile technique utilized including caps, mask, sterile gowns, sterile gloves, large sterile drape, hand hygiene, and Betadine prep. The right common femoral artery was interrogated with ultrasound and found to be widely patent. An image was obtained and stored for the medical record. Local anesthesia was attained by infiltration with 1% lidocaine. A small dermatotomy was made. Under real-time sonographic guidance, the vessel was punctured with a 21 gauge micropuncture needle. Using standard technique, the initial micro needle was exchanged over a 0.018 micro wire for a transitional 4 [place] micro sheath. The micro sheath was then exchanged over a 0.035 wire for a 5 French vascular sheath. A C2 cobra catheter was advanced over a Bentson wire up in over the aortic bifurcation and into the left internal iliac artery. The catheter was flushed and capped. With the catheter defining the aortic bifurcation and iliac arteries, attention was next turned toward performing the superior hypogastric  nerve block. The image intensifier was angled in till the L5 vertebral body could be viewed en face. Local anesthesia was attained by infiltration with 1% lidocaine. A 22 gauge 15 cm Chiba needle was then advanced under real-time fluoroscopic guidance onto the anterior surface of the L5 vertebral body. A small amount of contrast was injected in the AP and lateral projections confirming that the needle tip was in the retroperitoneal pre-spinal space. There is no evidence of venous extravasation. Next, a total of 20 mL ropivacaine was slowly injected while maintaining forward pressure on the needle. Vital signs were carefully monitored. There is no evidence of intravascular injection. The patient tolerated the injection well. The needle was removed. Attention was again turned to the embolization portion of the procedure. An arteriogram was performed from the catheter within the left internal iliac artery. A hypertrophic uterine artery is readily identified. A renegade hi Flo microcatheter was successfully navigated over a Fathom 16 wire into the horizontal segment of the left uterine artery. Arteriography was performed confirming catheter position. Particle embolization was then performed using 1 vial of 500-700 micron embospheres and 2.5 vials of 700-900 micron embospheres. Post embolization contrast injection demonstrates an appropriate angiographic endpoint of near stasis. The microcatheter was removed. The 5 French C2 cobra catheter was then formed into a Waltman loop in used to select the ipsilateral right internal iliac artery. Arteriography was performed. Again, there is a hypertrophic uterine artery. The microcatheter was reintroduced over the Fathom wire and advanced into the horizontal segment of the right uterine artery. Arteriography was performed confirming microcatheter position. Particle embolization was then performed using 1 vial of 500-700 micron embospheres and 2.25  vials of 700-900 micron  embospheres. Post embolization contrast injection demonstrates an appropriate angiographic endpoint of near stasis. The catheters were removed. A limited right common femoral arteriogram was then performed through the sheath confirming common femoral arterial access. Hemostasis was attained with the assistance of a 6 French Angio-Seal device. IMPRESSION: 1. Successful fluoroscopically guided superior hypogastric nerve block. 2. Successful bilateral uterine artery embolization. Signed, Criselda Peaches, MD, [place] [facility] Radiology Electronically Signed   By: Jacqulynn Cadet M.D.   On: 01/13/2019 16:55   Ir US Guide Vasc Access Right  Result Date: 01/13/2019 INDICATION: 38 year old female with uterine fibroids complicated by severe prolonged bleeding. She is currently admitted with symptomatic anemia. Her hemoglobin is less than 4 and she requires a multi unit transfusion. She has been highly resistant to hysterectomy. She presents now for superior hypogastric nerve block and bilateral uterine artery embolization in an effort to prevent further life-threatening blood-loss. EXAM: IR ULTRASOUND GUIDANCE VASC ACCESS RIGHT; IR FLUORO GUIDE NEEDLE PLACEMENT /BIOPSY; PELVIC SELECTIVE ARTERIOGRAPHY; IR EMBO TUMOR ORGAN ISCHEMIA INFARCT INC GUIDE ROADMAPPING; ADDITIONAL ARTERIOGRAPHY 1. Superior hypogastric nerve block under fluoroscopic guidance 2. Vascular access, right common femoral artery 3. Catheterization of the left internal iliac artery with arteriogram 4. Catheterization of the left uterine artery with arteriogram 5. Particle embolization of the left uterine artery to near stasis 6. Catheterization of the right internal iliac artery with arteriogram 7. Catheterization of the right uterine artery with arteriogram 8. Particle embolization of the right uterine artery to near stasis MEDICATIONS: 1 g Rocephin. The antibiotic was administered within 1 hour of  the procedure. 30 mg Toradol also administered IV during the procedure. ANESTHESIA/SEDATION: Moderate (conscious) sedation was employed during this procedure. A total of Versed 6 mg and Fentanyl 200 mcg was administered intravenously. Moderate Sedation Time: 61 minutes. The patient's level of consciousness and vital signs were monitored continuously by radiology nursing throughout the procedure under my direct supervision. CONTRAST:  12mL OMNIPAQUE IOHEXOL 300 MG/ML SOLN, 49mL OMNIPAQUE IOHEXOL 300 MG/ML SOLN FLUOROSCOPY TIME:  Fluoroscopy Time: 13 minutes 36 seconds (1135 mGy). COMPLICATIONS: None immediate. PROCEDURE: Informed consent was obtained from the patient following explanation of the procedure, risks, benefits and alternatives. The patient understands, agrees and consents for the procedure. All questions were addressed. A time out was performed prior to the initiation of the procedure. Maximal barrier sterile technique utilized including caps, mask, sterile gowns, sterile gloves, large sterile drape, hand hygiene, and Betadine prep. The right common femoral artery was interrogated with ultrasound and found to be widely patent. An image was obtained and stored for the medical record. Local anesthesia was attained by infiltration with 1% lidocaine. A small dermatotomy was made. Under real-time sonographic guidance, the vessel was punctured with a 21 gauge micropuncture needle. Using standard technique, the initial micro needle was exchanged over a 0.018 micro wire for a transitional 4 [place] micro sheath. The micro sheath was then exchanged over a 0.035 wire for a 5 French vascular sheath. A C2 cobra catheter was advanced over a Bentson wire up in over the aortic bifurcation and into the left internal iliac artery. The catheter was flushed and capped. With the catheter defining the aortic bifurcation and iliac arteries, attention was next turned toward performing the superior hypogastric nerve block. The  image intensifier was angled in till the L5 vertebral body could be viewed en face. Local anesthesia was attained by infiltration with 1% lidocaine. A 22 gauge 15 cm [place]  needle was then advanced under real-time fluoroscopic guidance onto the anterior surface of the L5 vertebral body. A small amount of contrast was injected in the AP and lateral projections confirming that the needle tip was in the retroperitoneal pre-spinal space. There is no evidence of venous extravasation. Next, a total of 20 mL ropivacaine was slowly injected while maintaining forward pressure on the needle. Vital signs were carefully monitored. There is no evidence of intravascular injection. The patient tolerated the injection well. The needle was removed. Attention was again turned to the embolization portion of the procedure. An arteriogram was performed from the catheter within the left internal iliac artery. A hypertrophic uterine artery is readily identified. A renegade hi Flo microcatheter was successfully navigated over a Fathom 16 wire into the horizontal segment of the left uterine artery. Arteriography was performed confirming catheter position. Particle embolization was then performed using 1 vial of 500-700 micron embospheres and 2.5 vials of 700-900 micron embospheres. Post embolization contrast injection demonstrates an appropriate angiographic endpoint of near stasis. The microcatheter was removed. The 5 French C2 cobra catheter was then formed into a Waltman loop in used to select the ipsilateral right internal iliac artery. Arteriography was performed. Again, there is a hypertrophic uterine artery. The microcatheter was reintroduced over the Fathom wire and advanced into the horizontal segment of the right uterine artery. Arteriography was performed confirming microcatheter position. Particle embolization was then performed using 1 vial of 500-700 micron embospheres and 2.25 vials of 700-900 micron embospheres. Post  embolization contrast injection demonstrates an appropriate angiographic endpoint of near stasis. The catheters were removed. A limited right common femoral arteriogram was then performed through the sheath confirming common femoral arterial access. Hemostasis was attained with the assistance of a 6 French Angio-Seal device. IMPRESSION: 1. Successful fluoroscopically guided superior hypogastric nerve block. 2. Successful bilateral uterine artery embolization. Signed, Criselda Peaches, MD, [place] [facility] Radiology Electronically Signed   By: Jacqulynn Cadet M.D.   On: 01/13/2019 16:55   Ir Fluoro Guide Ndl Plmt / Bx  Result Date: 01/13/2019 INDICATION: 38 year old female with uterine fibroids complicated by severe prolonged bleeding. She is currently admitted with symptomatic anemia. Her hemoglobin is less than 4 and she requires a multi unit transfusion. She has been highly resistant to hysterectomy. She presents now for superior hypogastric nerve block and bilateral uterine artery embolization in an effort to prevent further life-threatening blood-loss. EXAM: IR ULTRASOUND GUIDANCE VASC ACCESS RIGHT; IR FLUORO GUIDE NEEDLE PLACEMENT /BIOPSY; PELVIC SELECTIVE ARTERIOGRAPHY; IR EMBO TUMOR ORGAN ISCHEMIA INFARCT INC GUIDE ROADMAPPING; ADDITIONAL ARTERIOGRAPHY 1. Superior hypogastric nerve block under fluoroscopic guidance 2. Vascular access, right common femoral artery 3. Catheterization of the left internal iliac artery with arteriogram 4. Catheterization of the left uterine artery with arteriogram 5. Particle embolization of the left uterine artery to near stasis 6. Catheterization of the right internal iliac artery with arteriogram 7. Catheterization of the right uterine artery with arteriogram 8. Particle embolization of the right uterine artery to near stasis MEDICATIONS: 1 g Rocephin. The antibiotic was administered within 1 hour of the procedure. 30  mg Toradol also administered IV during the procedure. ANESTHESIA/SEDATION: Moderate (conscious) sedation was employed during this procedure. A total of Versed 6 mg and Fentanyl 200 mcg was administered intravenously. Moderate Sedation Time: 61 minutes. The patient's level of consciousness and vital signs were monitored continuously by radiology nursing throughout the procedure under my direct supervision. CONTRAST:  48mL OMNIPAQUE IOHEXOL 300 MG/ML SOLN,  60mL OMNIPAQUE IOHEXOL 300 MG/ML SOLN FLUOROSCOPY TIME:  Fluoroscopy Time: 13 minutes 36 seconds (1135 mGy). COMPLICATIONS: None immediate. PROCEDURE: Informed consent was obtained from the patient following explanation of the procedure, risks, benefits and alternatives. The patient understands, agrees and consents for the procedure. All questions were addressed. A time out was performed prior to the initiation of the procedure. Maximal barrier sterile technique utilized including caps, mask, sterile gowns, sterile gloves, large sterile drape, hand hygiene, and Betadine prep. The right common femoral artery was interrogated with ultrasound and found to be widely patent. An image was obtained and stored for the medical record. Local anesthesia was attained by infiltration with 1% lidocaine. A small dermatotomy was made. Under real-time sonographic guidance, the vessel was punctured with a 21 gauge micropuncture needle. Using standard technique, the initial micro needle was exchanged over a 0.018 micro wire for a transitional 4 [place] micro sheath. The micro sheath was then exchanged over a 0.035 wire for a 5 French vascular sheath. A C2 cobra catheter was advanced over a Bentson wire up in over the aortic bifurcation and into the left internal iliac artery. The catheter was flushed and capped. With the catheter defining the aortic bifurcation and iliac arteries, attention was next turned toward performing the superior hypogastric nerve block. The image intensifier  was angled in till the L5 vertebral body could be viewed en face. Local anesthesia was attained by infiltration with 1% lidocaine. A 22 gauge 15 cm Chiba needle was then advanced under real-time fluoroscopic guidance onto the anterior surface of the L5 vertebral body. A small amount of contrast was injected in the AP and lateral projections confirming that the needle tip was in the retroperitoneal pre-spinal space. There is no evidence of venous extravasation. Next, a total of 20 mL ropivacaine was slowly injected while maintaining forward pressure on the needle. Vital signs were carefully monitored. There is no evidence of intravascular injection. The patient tolerated the injection well. The needle was removed. Attention was again turned to the embolization portion of the procedure. An arteriogram was performed from the catheter within the left internal iliac artery. A hypertrophic uterine artery is readily identified. A renegade hi Flo microcatheter was successfully navigated over a Fathom 16 wire into the horizontal segment of the left uterine artery. Arteriography was performed confirming catheter position. Particle embolization was then performed using 1 vial of 500-700 micron embospheres and 2.5 vials of 700-900 micron embospheres. Post embolization contrast injection demonstrates an appropriate angiographic endpoint of near stasis. The microcatheter was removed. The 5 French C2 cobra catheter was then formed into a Waltman loop in used to select the ipsilateral right internal iliac artery. Arteriography was performed. Again, there is a hypertrophic uterine artery. The microcatheter was reintroduced over the Fathom wire and advanced into the horizontal segment of the right uterine artery. Arteriography was performed confirming microcatheter position. Particle embolization was then performed using 1 vial of 500-700 micron embospheres and 2.25 vials of 700-900 micron embospheres. Post embolization contrast  injection demonstrates an appropriate angiographic endpoint of near stasis. The catheters were removed. A limited right common femoral arteriogram was then performed through the sheath confirming common femoral arterial access. Hemostasis was attained with the assistance of a 6 French Angio-Seal device. IMPRESSION: 1. Successful fluoroscopically guided superior hypogastric nerve block. 2. Successful bilateral uterine artery embolization. Signed, Criselda Peaches, MD, [place] [facility] Radiology Electronically Signed   By: Jacqulynn Cadet M.D.   On: 01/13/2019 16:55  Ir Embo Tumor Organ Ischemia Infarct Inc Guide Roadmapping  Result Date: 01/13/2019 INDICATION: 38 year old female with uterine fibroids complicated by severe prolonged bleeding. She is currently admitted with symptomatic anemia. Her hemoglobin is less than 4 and she requires a multi unit transfusion. She has been highly resistant to hysterectomy. She presents now for superior hypogastric nerve block and bilateral uterine artery embolization in an effort to prevent further life-threatening blood-loss. EXAM: IR ULTRASOUND GUIDANCE VASC ACCESS RIGHT; IR FLUORO GUIDE NEEDLE PLACEMENT /BIOPSY; PELVIC SELECTIVE ARTERIOGRAPHY; IR EMBO TUMOR ORGAN ISCHEMIA INFARCT INC GUIDE ROADMAPPING; ADDITIONAL ARTERIOGRAPHY 1. Superior hypogastric nerve block under fluoroscopic guidance 2. Vascular access, right common femoral artery 3. Catheterization of the left internal iliac artery with arteriogram 4. Catheterization of the left uterine artery with arteriogram 5. Particle embolization of the left uterine artery to near stasis 6. Catheterization of the right internal iliac artery with arteriogram 7. Catheterization of the right uterine artery with arteriogram 8. Particle embolization of the right uterine artery to near stasis MEDICATIONS: 1 g Rocephin. The antibiotic was administered within 1 hour of the  procedure. 30 mg Toradol also administered IV during the procedure. ANESTHESIA/SEDATION: Moderate (conscious) sedation was employed during this procedure. A total of Versed 6 mg and Fentanyl 200 mcg was administered intravenously. Moderate Sedation Time: 61 minutes. The patient's level of consciousness and vital signs were monitored continuously by radiology nursing throughout the procedure under my direct supervision. CONTRAST:  78mL OMNIPAQUE IOHEXOL 300 MG/ML SOLN, 22mL OMNIPAQUE IOHEXOL 300 MG/ML SOLN FLUOROSCOPY TIME:  Fluoroscopy Time: 13 minutes 36 seconds (1135 mGy). COMPLICATIONS: None immediate. PROCEDURE: Informed consent was obtained from the patient following explanation of the procedure, risks, benefits and alternatives. The patient understands, agrees and consents for the procedure. All questions were addressed. A time out was performed prior to the initiation of the procedure. Maximal barrier sterile technique utilized including caps, mask, sterile gowns, sterile gloves, large sterile drape, hand hygiene, and Betadine prep. The right common femoral artery was interrogated with ultrasound and found to be widely patent. An image was obtained and stored for the medical record. Local anesthesia was attained by infiltration with 1% lidocaine. A small dermatotomy was made. Under real-time sonographic guidance, the vessel was punctured with a 21 gauge micropuncture needle. Using standard technique, the initial micro needle was exchanged over a 0.018 micro wire for a transitional 4 [place] micro sheath. The micro sheath was then exchanged over a 0.035 wire for a 5 French vascular sheath. A C2 cobra catheter was advanced over a Bentson wire up in over the aortic bifurcation and into the left internal iliac artery. The catheter was flushed and capped. With the catheter defining the aortic bifurcation and iliac arteries, attention was next turned toward performing the superior hypogastric nerve block. The image  intensifier was angled in till the L5 vertebral body could be viewed en face. Local anesthesia was attained by infiltration with 1% lidocaine. A 22 gauge 15 cm Chiba needle was then advanced under real-time fluoroscopic guidance onto the anterior surface of the L5 vertebral body. A small amount of contrast was injected in the AP and lateral projections confirming that the needle tip was in the retroperitoneal pre-spinal space. There is no evidence of venous extravasation. Next, a total of 20 mL ropivacaine was slowly injected while maintaining forward pressure on the needle. Vital signs were carefully monitored. There is no evidence of intravascular injection. The patient tolerated the injection well. The needle was removed. Attention was again turned to  the embolization portion of the procedure. An arteriogram was performed from the catheter within the left internal iliac artery. A hypertrophic uterine artery is readily identified. A renegade hi Flo microcatheter was successfully navigated over a Fathom 16 wire into the horizontal segment of the left uterine artery. Arteriography was performed confirming catheter position. Particle embolization was then performed using 1 vial of 500-700 micron embospheres and 2.5 vials of 700-900 micron embospheres. Post embolization contrast injection demonstrates an appropriate angiographic endpoint of near stasis. The microcatheter was removed. The 5 French C2 cobra catheter was then formed into a Waltman loop in used to select the ipsilateral right internal iliac artery. Arteriography was performed. Again, there is a hypertrophic uterine artery. The microcatheter was reintroduced over the Fathom wire and advanced into the horizontal segment of the right uterine artery. Arteriography was performed confirming microcatheter position. Particle embolization was then performed using 1 vial of 500-700 micron embospheres and 2.25 vials of 700-900 micron embospheres. Post embolization  contrast injection demonstrates an appropriate angiographic endpoint of near stasis. The catheters were removed. A limited right common femoral arteriogram was then performed through the sheath confirming common femoral arterial access. Hemostasis was attained with the assistance of a 6 French Angio-Seal device. IMPRESSION: 1. Successful fluoroscopically guided superior hypogastric nerve block. 2. Successful bilateral uterine artery embolization. Signed, Criselda Peaches, MD, [facility] Radiology Electronically Signed   By: Jacqulynn Cadet M.D.   On: 01/13/2019 16:55    Lab Data:  CBC: Recent Labs  Lab 01/13/19 0953 01/13/19 1224 01/14/19 0540  WBC 10.9* 8.0 9.3  HGB 3.2* 5.0* 6.1*  HCT 11.8* 16.5* 19.6*  MCV 75.6* 82.5 81.3  PLT 490* 318 [id number]   Basic Metabolic Panel: Recent Labs  Lab 01/13/19 0953 01/13/19 1153 01/14/19 0540  NA 135  --  136  K 3.3*  --  4.0  CL 106  --  107  CO2 18*  --  21*  GLUCOSE 113*  --  125*  BUN 9  --  5*  CREATININE 0.76  --  0.53  CALCIUM 8.8*  --  8.5*  MG  --  1.8  --    GFR: Estimated Creatinine Clearance: 99.6 mL/min (by C-G formula based on SCr of 0.53 mg/dL). Liver Function Tests: Recent Labs  Lab 01/14/19 0540  AST 14*  ALT 16  ALKPHOS 34*  BILITOT 1.2  PROT 6.0*  ALBUMIN 3.5   No results for input(s): LIPASE, AMYLASE in the last 168 hours. No results for input(s): AMMONIA in the last 168 hours. Coagulation Profile: No results for input(s): INR, PROTIME in the last 168 hours. Cardiac Enzymes: No results for input(s): CKTOTAL, CKMB, CKMBINDEX, TROPONINI in the last 168 hours. BNP (last 3 results) No results for input(s): PROBNP in the last 8760 hours. HbA1C: No results for input(s): HGBA1C in the last 72 hours. CBG: No results for input(s): GLUCAP in the last 168 hours. Lipid Profile: No results for input(s): CHOL, HDL, LDLCALC, TRIG, CHOLHDL, LDLDIRECT in the last  72 hours. Thyroid Function Tests: No results for input(s): TSH, T4TOTAL, FREET4, T3FREE, THYROIDAB in the last 72 hours. Anemia Panel: Recent Labs    01/14/19 0815  VITAMINB12 851  FOLATE 13.2  FERRITIN 4*  TIBC 452*  IRON 20*  RETICCTPCT 3.0   Urine analysis:    Component Value Date/Time   COLORURINE YELLOW 11/13/2017 0955   APPEARANCEUR CLEAR 11/13/2017 0955   LABSPEC >1.030 (H) 11/13/2017 [id number]  PHURINE 6.0 11/13/2017 0955   GLUCOSEU NEGATIVE 11/13/2017 0955   HGBUR MODERATE (A) 11/13/2017 0955   BILIRUBINUR NEGATIVE 11/13/2017 0955   KETONESUR NEGATIVE 11/13/2017 0955   PROTEINUR NEGATIVE 11/13/2017 0955   NITRITE NEGATIVE 11/13/2017 0955   LEUKOCYTESUR NEGATIVE 11/13/2017 0955     Becky Larson M.D. [facility] 01/14/2019, 11:20 AM  Pager: [phone] Between 7am to 7pm - call Pager - [phone]  After 7pm go to [email or web address] - password [email or web address]  Call night coverage person covering after 7pm

## 2019-01-14 NOTE — Consult Note (Addendum)
GYNECOLOGIC ONCOLOGY INPATIENT CONSULTATION  Date of Service: 01/14/19 Consulting Provider: Jeral Pinch, MD   SUBJECTIVE: Doing well this morning, feels much better after blood transfusion. Reports cramping/pain similar to childbirth after the procedure, at worst 10 out of 10.  Medications help alleviate this pain.  She reports significant decrease in her bleeding.  The chucks underneath her was changed once overnight.  She has not felt passage of any clots or significant amount of blood.  She is tolerating clears, reports being very hungry.  MEDICATIONS:  Current Facility-Administered Medications:  .  0.9 %  sodium chloride infusion (Manually program via Guardrails IV Fluids), , Intravenous, Once, Rai, Ripudeep K, MD .  0.9 %  sodium chloride infusion, 250 mL, Intravenous, PRN, Jacqulynn Cadet, MD .  Chlorhexidine Gluconate Cloth 2 % PADS 6 each, 6 each, Topical, Daily, Georgette Shell, MD .  diphenhydrAMINE (BENADRYL) capsule 25 mg, 25 mg, Oral, Q6H PRN, Jacqulynn Cadet, MD, 25 mg at 01/14/19 0152 .  docusate sodium (COLACE) capsule 100 mg, 100 mg, Oral, BID, Jacqulynn Cadet, MD, 100 mg at 01/13/19 2046 .  HYDROmorphone (DILAUDID) injection 1 mg, 1 mg, Intravenous, Q3H PRN, Jacqulynn Cadet, MD, 1 mg at 01/14/19 0524 .  LORazepam (ATIVAN) tablet 1 mg, 1 mg, Oral, Once, Ward, [employer], PA-C .  naproxen sodium (ANAPROX) tablet 550 mg, 550 mg, Oral, BID WC, Jacqulynn Cadet, MD .  ondansetron [place]) injection 4 mg, 4 mg, Intravenous, Q6H PRN, Jacqulynn Cadet, MD .  oxyCODONE-acetaminophen (PERCOCET/ROXICET) 5-325 MG per tablet 1-2 tablet, 1-2 tablet, Oral, Q4H PRN, Jacqulynn Cadet, MD, 1 tablet at 01/13/19 1931 .  predniSONE (DELTASONE) tablet 20 mg, 20 mg, Oral, Q breakfast, Jacqulynn Cadet, MD .  promethazine (PHENERGAN) tablet 25 mg, 25 mg, Oral, Q8H PRN **OR** promethazine (PHENERGAN) suppository 25 mg, 25 mg, Rectal, Q8H PRN, Jacqulynn Cadet, MD .  sodium  chloride flush (NS) 0.9 % injection 3 mL, 3 mL, Intravenous, Q12H, Jacqulynn Cadet, MD, 3 mL at 01/13/19 2047 .  sodium chloride flush (NS) 0.9 % injection 3 mL, 3 mL, Intravenous, PRN, Jacqulynn Cadet, MD   Intake/Output Summary (Last 24 hours) at 01/14/2019 0817 Last data filed at 01/14/2019 0542 Gross per 24 hour  Intake 2750.93 ml  Output 1200 ml  Net 1550.93 ml    PHYSICAL EXAM: BP 114/65 (BP Location: Left Arm)   Pulse 74   Temp 98.5 F (36.9 C) (Oral)   Resp 18   Ht 5\' 6"  (1.676 m)   Wt 165 lb (74.8 kg)   LMP 01/13/2019   SpO2 100%   BMI 26.63 kg/m  General: Alert, oriented, no acute distress. HEENT: Normocephalic, atraumatic.  Conjunctival pallor improved. Chest: Clear to auscultation bilaterally. Cardiovascular: Mildly tachycardic in low 100s, regular rhythm, no murmurs, rubs, or gallops. Abdomen: Normoactive bowel sounds.  Soft, nondistended, mild tenderness to palpation in lower quadrants.  No evidence of hernia.  No palpable fluid wave.   Extremities: Grossly normal range of motion.  Warm, well perfused.  No edema bilaterally. Skin: No rashes or lesions. GU: Foley catheter in place, no active bleeding noted.  Older appearing blood on checks beneath patient.  LABORATORY AND RADIOLOGIC DATA: CBC    Component Value Date/Time   WBC 9.3 01/14/2019 0540   RBC 2.41 (L) 01/14/2019 0540   HGB 6.1 (LL) 01/14/2019 0540   HGB 5.7 (LL) 01/02/2019 1531   HGB 6.1 (LL) 03/17/2018 1641   HCT 19.6 (L) 01/14/2019 0540   HCT 21.2 (L) 03/17/2018 1641  PLT 360 01/14/2019 0540   PLT 255 01/02/2019 1531   PLT 473 (H) 03/17/2018 1641   MCV 81.3 01/14/2019 0540   MCV 68 (L) 03/17/2018 1641   MCH 25.3 (L) 01/14/2019 0540   MCHC 31.1 01/14/2019 0540   RDW 20.1 (H) 01/14/2019 0540   RDW 17.0 (H) 03/17/2018 1641   LYMPHSABS 2.1 12/22/2018 1551   MONOABS 0.4 12/22/2018 1551   EOSABS 0.3 12/22/2018 1551   BASOSABS 0.0 12/22/2018 1551   CMP Latest Ref Rng & Units 01/14/2019  01/13/2019 12/22/2018  Glucose 70 - 99 mg/dL 125(H) 113(H) 103(H)  BUN 6 - 20 mg/dL 5(L) 9 15  Creatinine 0.44 - 1.00 mg/dL 0.53 0.76 0.56  Sodium 135 - 145 mmol/L 136 135 138  Potassium 3.5 - 5.1 mmol/L 4.0 3.3(L) 3.3(L)  Chloride 98 - 111 mmol/L 107 106 107  CO2 22 - 32 mmol/L 21(L) 18(L) 24  Calcium 8.9 - 10.3 mg/dL 8.5(L) 8.8(L) 8.5(L)  Total Protein 6.5 - 8.1 g/dL 6.0(L) - 6.4(L)  Total Bilirubin 0.3 - 1.2 mg/dL 1.2 - 0.6  Alkaline Phos 38 - 126 U/L 34(L) - 48  AST 15 - 41 U/L 14(L) - 17  ALT 0 - 44 U/L 16 - 17     ASSESSMENT AND PLAN: Becky Larson is a 38 y.o. woman with profound symptomatic anemia secondary to large prolapsing fibroid now status post uterine artery embolization with significant improvement in her vaginal bleeding.  Discussed again with the patient my concern regarding her profound anemia and likely continued, albeit less, bleeding in the setting of her prolapsing fibroid.  I continue to recommend proceeding with hysterectomy.  The patient is willing to consider this possibility.  She knows that I have tentatively scheduled her for surgery on Thursday.  We discussed that I would be willing to attempt open myomectomy although I think the risk of requiring hysterectomy is likely greater than 90 to 95%.  The patient would like an effort made to preserve her uterus but understands the high likelihood that hysterectomy will be required.  She plans to speak with her husband today once he arrives.  I have left a hysterectomy consent form for insurance purposes at bedside for her to sign after she discusses with her husband.  I agree with further transfusion today.  Please keep a pad count especially after patient's Foley catheter is out and she is able to ambulate post procedure.  If H&H are stable tomorrow with no significant bleeding, I think she can be discharged home with plan for surgery later in the week.  If the patient were to bleed significantly or drop her  hemoglobin, please alert me and give a dose of IV Premarin.  Jeral Pinch, MD Gynecologic Oncology [phone]

## 2019-01-15 LAB — BPAM RBC
Blood Product Expiration Date: 202011242359
Blood Product Expiration Date: 202011242359
Blood Product Expiration Date: 202011282359
Blood Product Expiration Date: 202011282359
ISSUE DATE / TIME: 202010231100
ISSUE DATE / TIME: 202010231442
ISSUE DATE / TIME: 202010240924
ISSUE DATE / TIME: 202010241237
Unit Type and Rh: 5100
Unit Type and Rh: 5100
Unit Type and Rh: 5100
Unit Type and Rh: 5100

## 2019-01-15 LAB — TYPE AND SCREEN
ABO/RH(D): O POS
Antibody Screen: NEGATIVE
Unit division: 0
Unit division: 0
Unit division: 0
Unit division: 0

## 2019-01-15 LAB — BASIC METABOLIC PANEL
Anion gap: 8 (ref 5–15)
BUN: 10 mg/dL (ref 6–20)
CO2: 22 mmol/L (ref 22–32)
Calcium: 8.4 mg/dL — ABNORMAL LOW (ref 8.9–10.3)
Chloride: 107 mmol/L (ref 98–111)
Creatinine, Ser: 0.6 mg/dL (ref 0.44–1.00)
GFR calc Af Amer: >60 mL/min (ref >60)
GFR calc non Af Amer: >60 mL/min (ref >60)
Glucose, Bld: 91 mg/dL (ref 70–99)
Potassium: 3.2 mmol/L — ABNORMAL LOW (ref 3.5–5.1)
Sodium: 137 mmol/L (ref 135–145)

## 2019-01-15 LAB — CBC
HCT: 24.3 % — ABNORMAL LOW (ref 36.0–46.0)
Hemoglobin: 7.5 g/dL — ABNORMAL LOW (ref 12.0–15.0)
MCH: 26.2 pg (ref 26.0–34.0)
MCHC: 30.9 g/dL (ref 30.0–36.0)
MCV: 85 fL (ref 80.0–100.0)
Platelets: 316 10*3/uL (ref 150–400)
RBC: 2.86 MIL/uL — ABNORMAL LOW (ref 3.87–5.11)
RDW: 18.9 % — ABNORMAL HIGH (ref 11.5–15.5)
WBC: 8.6 10*3/uL (ref 4.0–10.5)
nRBC: 0.7 % — ABNORMAL HIGH (ref 0.0–0.2)

## 2019-01-15 MED ORDER — POLYSACCHARIDE IRON COMPLEX 150 MG PO CAPS: 150.0000 mg | ORAL_CAPSULE | Freq: Two times a day (BID) | ORAL | 3 refills | Status: DC

## 2019-01-15 MED ORDER — POTASSIUM CHLORIDE CRYS ER 20 MEQ PO TBCR
40.0000 meq | EXTENDED_RELEASE_TABLET | Freq: Once | ORAL | Status: AC
  Administered 2019-01-15: 40 meq via ORAL
  Filled 2019-01-15: qty 2

## 2019-01-15 MED ORDER — DOCUSATE SODIUM 100 MG PO CAPS: 100.0000 mg | ORAL_CAPSULE | Freq: Two times a day (BID) | ORAL | 2 refills | Status: DC

## 2019-01-15 NOTE — Progress Notes (Signed)
Patient discharged home, discharge instructions given and explained to patient, she verbalized understanding, denies any distress, accompanied home by husband.

## 2019-01-15 NOTE — Progress Notes (Signed)
Patient took her telemetry off stated her GYN was just left her room and stated she can take it off, informed patient that the MD did not put the order in and she has to have a order. She stated she is not putting it back on because it's irritating her and she will be discharge today too.

## 2019-01-15 NOTE — Progress Notes (Signed)
Patient/husband called after discharge asking for prescription on pain medication, Notified doctor Rai of patient request and she stated that patient can take tylenol or ibuprofen. Called patient/husband back and informed them of MD recommendation for tylenol/Ibuprofen and they verbalized understanding.

## 2019-01-15 NOTE — Discharge Summary (Signed)
Physician Discharge Summary   Patient ID: Becky Larson MRN: [id number] DOB/AGE: [birth year] 38 y.o.  Admit date: 01/13/2019 Discharge date: 01/15/2019  Primary Care Physician:  Patient, No Pcp Per   Recommendations for Outpatient Follow-up:  Patient has been given instructions by Dr. Berline Lopes for surgery on 01/19/2019 for myomectomy with possible hysterectomy   Home Health: None  Equipment/Devices:   Discharge Condition: stable  CODE STATUS: FULL  Diet recommendation: Heart healthy diet   Discharge Diagnoses:    . Acute blood loss anemia . Menorrhagia . Symptomatic iron deficiency anemia . Intramural and submucous leiomyoma of uterus Hypokalemia  Consults: OB/GYN, Dr. Berline Lopes    Allergies:   Allergies  Allergen Reactions  . Morphine And Related Itching    respritatory issues as well     DISCHARGE MEDICATIONS: Allergies as of 01/15/2019      Reactions   Morphine And Related Itching   respritatory issues as well      Medication List    TAKE these medications   docusate sodium 100 MG capsule Commonly known as: COLACE Take 1 capsule (100 mg total) by mouth 2 (two) times daily. For constipation Notes to patient: 01/15/19   iron polysaccharides 150 MG capsule Commonly known as: NIFEREX Take 1 capsule (150 mg total) by mouth 2 (two) times daily. Notes to patient: 01/15/19   LUPRON DEPOT (57-MONTH) IM Inject 1 Dose into the muscle every 30 (thirty) days.   naproxen sodium 220 MG tablet Commonly known as: ALEVE Take 440 mg by mouth 2 (two) times daily as needed (chest pain).        Brief H and P: For complete details please refer to admission H and P, but in brief Patient is a 38 year old female with history of bleeding fibroids, ectopic pregnancy, chronic anemia had Lupron shot last week.  Patient presented with severe bleeding to the point that she was changing 3 pads every hour.  Patient also reported chest pain, shortness of breath.  Patient had  nausea and 3 episodes of vomiting in ED. In ED, patient was found to have hemoglobin of 3.2, potassium 3.3, creatinine 0.76.  Tachycardia 116 Patient was started on blood transfusion and OB/GYN was consulted.  COVID-19 test negative.  Hospital Course:   Acute blood loss anemia, symptomatic, severe secondary to bleeding uterine fibroids -Patient presented with hemoglobin of 3.2, received a total of 4 units of packed RBC transfusion, hemoglobin 7.5 at the time of discharge. -Anemia profile showed significant iron deficiency, Fe 20, ferritin 4, percent saturation ratio 4 -Patient also received 1 dose of IV Feraheme -Patient reports that she is not taking oral iron supplements due to constipation -OB/GYN and IR were consulted.  Patient underwent uterine artery embolization on 01/13/2019.  Bleeding is significantly improving.   -H&H currently stable, cleared for discharge home by Dr. Berline Lopes.  Patient was given instructions for presurgery Covid testing and surgery planned on 01/19/2023 myomectomy with possible hysterectomy     Menorrhagia secondary to intramural and submucous leiomyoma of uterus, prolapsing fibroid -As #1, OB/GYN following, plan for surgery on 10/29   Iron deficiency anemia -Anemia panel consistent with severe iron deficiency anemia, received 1 dose of IV Feraheme Placed on oral iron supplementation.   Hypokalemia -Replaced   Day of Discharge S: Bleeding a lot better, wants to go home.  No chest pain, shortness of breath or dizziness.  BP 105/67 (BP Location: Right Arm)   Pulse 66   Temp 98 F (36.7 C) (Oral)  Resp 14   Ht 5\' 6"  (1.676 m)   Wt 74.8 kg   LMP 01/13/2019   SpO2 100%   BMI 26.63 kg/m   Physical Exam: General: Alert and awake oriented x3 not in any acute distress. HEENT: anicteric sclera, pupils reactive to light and accommodation CVS: S1-S2 clear no murmur rubs or gallops Chest: clear to auscultation bilaterally, no wheezing rales or  rhonchi Abdomen: soft nontender, nondistended, normal bowel sounds Extremities: no cyanosis, clubbing or edema noted bilaterally Neuro: Cranial nerves II-XII intact, no focal neurological deficits   The results of significant diagnostics from this hospitalization (including imaging, microbiology, ancillary and laboratory) are listed below for reference.      Procedures/Studies:  Dg Chest 2 View  Result Date: 01/13/2019 CLINICAL DATA:  Chest pain and shortness of breath EXAM: CHEST - 2 VIEW COMPARISON:  02/09/2017 FINDINGS: Cardiac shadow is stable. The lungs are well aerated bilaterally. No focal infiltrate or sizable effusion is seen. No acute bony abnormality is noted. IMPRESSION: No active cardiopulmonary disease. Electronically Signed   By: Inez Catalina M.D.   On: 01/13/2019 10:39   Ir Angiogram Pelvis Selective Or Supraselective  Result Date: 01/13/2019 INDICATION: 38 year old female with uterine fibroids complicated by severe prolonged bleeding. She is currently admitted with symptomatic anemia. Her hemoglobin is less than 4 and she requires a multi unit transfusion. She has been highly resistant to hysterectomy. She presents now for superior hypogastric nerve block and bilateral uterine artery embolization in an effort to prevent further life-threatening blood-loss. EXAM: IR ULTRASOUND GUIDANCE VASC ACCESS RIGHT; IR FLUORO GUIDE NEEDLE PLACEMENT /BIOPSY; PELVIC SELECTIVE ARTERIOGRAPHY; IR EMBO TUMOR ORGAN ISCHEMIA INFARCT INC GUIDE ROADMAPPING; ADDITIONAL ARTERIOGRAPHY 1. Superior hypogastric nerve block under fluoroscopic guidance 2. Vascular access, right common femoral artery 3. Catheterization of the left internal iliac artery with arteriogram 4. Catheterization of the left uterine artery with arteriogram 5. Particle embolization of the left uterine artery to near stasis 6. Catheterization of the right internal iliac artery with arteriogram 7. Catheterization of the right uterine  artery with arteriogram 8. Particle embolization of the right uterine artery to near stasis MEDICATIONS: 1 g Rocephin. The antibiotic was administered within 1 hour of the procedure. 30 mg Toradol also administered IV during the procedure. ANESTHESIA/SEDATION: Moderate (conscious) sedation was employed during this procedure. A total of Versed 6 mg and Fentanyl 200 mcg was administered intravenously. Moderate Sedation Time: 61 minutes. The patient's level of consciousness and vital signs were monitored continuously by radiology nursing throughout the procedure under my direct supervision. CONTRAST:  72mL OMNIPAQUE IOHEXOL 300 MG/ML SOLN, 19mL OMNIPAQUE IOHEXOL 300 MG/ML SOLN FLUOROSCOPY TIME:  Fluoroscopy Time: 13 minutes 36 seconds (1135 mGy). COMPLICATIONS: None immediate. PROCEDURE: Informed consent was obtained from the patient following explanation of the procedure, risks, benefits and alternatives. The patient understands, agrees and consents for the procedure. All questions were addressed. A time out was performed prior to the initiation of the procedure. Maximal barrier sterile technique utilized including caps, mask, sterile gowns, sterile gloves, large sterile drape, hand hygiene, and Betadine prep. The right common femoral artery was interrogated with ultrasound and found to be widely patent. An image was obtained and stored for the medical record. Local anesthesia was attained by infiltration with 1% lidocaine. A small dermatotomy was made. Under real-time sonographic guidance, the vessel was punctured with a 21 gauge micropuncture needle. Using standard technique, the initial micro needle was exchanged over a 0.018 micro wire for a transitional 4 [place] micro sheath.  The micro sheath was then exchanged over a 0.035 wire for a 5 French vascular sheath. A C2 cobra catheter was advanced over a Bentson wire up in over the aortic bifurcation and into the left internal iliac artery. The catheter was flushed  and capped. With the catheter defining the aortic bifurcation and iliac arteries, attention was next turned toward performing the superior hypogastric nerve block. The image intensifier was angled in till the L5 vertebral body could be viewed en face. Local anesthesia was attained by infiltration with 1% lidocaine. A 22 gauge 15 cm Chiba needle was then advanced under real-time fluoroscopic guidance onto the anterior surface of the L5 vertebral body. A small amount of contrast was injected in the AP and lateral projections confirming that the needle tip was in the retroperitoneal pre-spinal space. There is no evidence of venous extravasation. Next, a total of 20 mL ropivacaine was slowly injected while maintaining forward pressure on the needle. Vital signs were carefully monitored. There is no evidence of intravascular injection. The patient tolerated the injection well. The needle was removed. Attention was again turned to the embolization portion of the procedure. An arteriogram was performed from the catheter within the left internal iliac artery. A hypertrophic uterine artery is readily identified. A renegade hi Flo microcatheter was successfully navigated over a Fathom 16 wire into the horizontal segment of the left uterine artery. Arteriography was performed confirming catheter position. Particle embolization was then performed using 1 vial of 500-700 micron embospheres and 2.5 vials of 700-900 micron embospheres. Post embolization contrast injection demonstrates an appropriate angiographic endpoint of near stasis. The microcatheter was removed. The 5 French C2 cobra catheter was then formed into a Waltman loop in used to select the ipsilateral right internal iliac artery. Arteriography was performed. Again, there is a hypertrophic uterine artery. The microcatheter was reintroduced over the Fathom wire and advanced into the horizontal segment of the right uterine artery. Arteriography was performed confirming  microcatheter position. Particle embolization was then performed using 1 vial of 500-700 micron embospheres and 2.25 vials of 700-900 micron embospheres. Post embolization contrast injection demonstrates an appropriate angiographic endpoint of near stasis. The catheters were removed. A limited right common femoral arteriogram was then performed through the sheath confirming common femoral arterial access. Hemostasis was attained with the assistance of a 6 French Angio-Seal device. IMPRESSION: 1. Successful fluoroscopically guided superior hypogastric nerve block. 2. Successful bilateral uterine artery embolization. Signed, Criselda Peaches, MD, [facility] Electronically Signed   By: Jacqulynn Cadet M.D.   On: 01/13/2019 16:55   Ir Angiogram Pelvis Selective Or Supraselective  Result Date: 01/13/2019 INDICATION: 38 year old female with uterine fibroids complicated by severe prolonged bleeding. She is currently admitted with symptomatic anemia. Her hemoglobin is less than 4 and she requires a multi unit transfusion. She has been highly resistant to hysterectomy. She presents now for superior hypogastric nerve block and bilateral uterine artery embolization in an effort to prevent further life-threatening blood-loss. EXAM: IR ULTRASOUND GUIDANCE VASC ACCESS RIGHT; IR FLUORO GUIDE NEEDLE PLACEMENT /BIOPSY; PELVIC SELECTIVE ARTERIOGRAPHY; IR EMBO TUMOR ORGAN ISCHEMIA INFARCT INC GUIDE ROADMAPPING; ADDITIONAL ARTERIOGRAPHY 1. Superior hypogastric nerve block under fluoroscopic guidance 2. Vascular access, right common femoral artery 3. Catheterization of the left internal iliac artery with arteriogram 4. Catheterization of the left uterine artery with arteriogram 5. Particle embolization of the left uterine artery to near stasis 6. Catheterization of the right internal iliac artery with arteriogram 7. Catheterization of  the right uterine artery  with arteriogram 8. Particle embolization of the right uterine artery to near stasis MEDICATIONS: 1 g Rocephin. The antibiotic was administered within 1 hour of the procedure. 30 mg Toradol also administered IV during the procedure. ANESTHESIA/SEDATION: Moderate (conscious) sedation was employed during this procedure. A total of Versed 6 mg and Fentanyl 200 mcg was administered intravenously. Moderate Sedation Time: 61 minutes. The patient's level of consciousness and vital signs were monitored continuously by radiology nursing throughout the procedure under my direct supervision. CONTRAST:  4mL OMNIPAQUE IOHEXOL 300 MG/ML SOLN, 43mL OMNIPAQUE IOHEXOL 300 MG/ML SOLN FLUOROSCOPY TIME:  Fluoroscopy Time: 13 minutes 36 seconds (1135 mGy). COMPLICATIONS: None immediate. PROCEDURE: Informed consent was obtained from the patient following explanation of the procedure, risks, benefits and alternatives. The patient understands, agrees and consents for the procedure. All questions were addressed. A time out was performed prior to the initiation of the procedure. Maximal barrier sterile technique utilized including caps, mask, sterile gowns, sterile gloves, large sterile drape, hand hygiene, and Betadine prep. The right common femoral artery was interrogated with ultrasound and found to be widely patent. An image was obtained and stored for the medical record. Local anesthesia was attained by infiltration with 1% lidocaine. A small dermatotomy was made. Under real-time sonographic guidance, the vessel was punctured with a 21 gauge micropuncture needle. Using standard technique, the initial micro needle was exchanged over a 0.018 micro wire for a transitional 4 [place] micro sheath. The micro sheath was then exchanged over a 0.035 wire for a 5 French vascular sheath. A C2 cobra catheter was advanced over a Bentson wire up in over the aortic bifurcation and into the left internal iliac artery. The catheter was flushed and  capped. With the catheter defining the aortic bifurcation and iliac arteries, attention was next turned toward performing the superior hypogastric nerve block. The image intensifier was angled in till the L5 vertebral body could be viewed en face. Local anesthesia was attained by infiltration with 1% lidocaine. A 22 gauge 15 cm Chiba needle was then advanced under real-time fluoroscopic guidance onto the anterior surface of the L5 vertebral body. A small amount of contrast was injected in the AP and lateral projections confirming that the needle tip was in the retroperitoneal pre-spinal space. There is no evidence of venous extravasation. Next, a total of 20 mL ropivacaine was slowly injected while maintaining forward pressure on the needle. Vital signs were carefully monitored. There is no evidence of intravascular injection. The patient tolerated the injection well. The needle was removed. Attention was again turned to the embolization portion of the procedure. An arteriogram was performed from the catheter within the left internal iliac artery. A hypertrophic uterine artery is readily identified. A renegade hi Flo microcatheter was successfully navigated over a Fathom 16 wire into the horizontal segment of the left uterine artery. Arteriography was performed confirming catheter position. Particle embolization was then performed using 1 vial of 500-700 micron embospheres and 2.5 vials of 700-900 micron embospheres. Post embolization contrast injection demonstrates an appropriate angiographic endpoint of near stasis. The microcatheter was removed. The 5 French C2 cobra catheter was then formed into a Waltman loop in used to select the ipsilateral right internal iliac artery. Arteriography was performed. Again, there is a hypertrophic uterine artery. The microcatheter was reintroduced over the Fathom wire and advanced into the horizontal segment of the right uterine artery. Arteriography was performed confirming  microcatheter position. Particle embolization was then performed using 1 vial of  500-700 micron embospheres and 2.25 vials of 700-900 micron embospheres. Post embolization contrast injection demonstrates an appropriate angiographic endpoint of near stasis. The catheters were removed. A limited right common femoral arteriogram was then performed through the sheath confirming common femoral arterial access. Hemostasis was attained with the assistance of a 6 French Angio-Seal device. IMPRESSION: 1. Successful fluoroscopically guided superior hypogastric nerve block. 2. Successful bilateral uterine artery embolization. Signed, Criselda Peaches, MD, [facility] Electronically Signed   By: Jacqulynn Cadet M.D.   On: 01/13/2019 16:55   Ir Angiogram Selective Each Additional Vessel  Result Date: 01/13/2019 INDICATION: 38 year old female with uterine fibroids complicated by severe prolonged bleeding. She is currently admitted with symptomatic anemia. Her hemoglobin is less than 4 and she requires a multi unit transfusion. She has been highly resistant to hysterectomy. She presents now for superior hypogastric nerve block and bilateral uterine artery embolization in an effort to prevent further life-threatening blood-loss. EXAM: IR ULTRASOUND GUIDANCE VASC ACCESS RIGHT; IR FLUORO GUIDE NEEDLE PLACEMENT /BIOPSY; PELVIC SELECTIVE ARTERIOGRAPHY; IR EMBO TUMOR ORGAN ISCHEMIA INFARCT INC GUIDE ROADMAPPING; ADDITIONAL ARTERIOGRAPHY 1. Superior hypogastric nerve block under fluoroscopic guidance 2. Vascular access, right common femoral artery 3. Catheterization of the left internal iliac artery with arteriogram 4. Catheterization of the left uterine artery with arteriogram 5. Particle embolization of the left uterine artery to near stasis 6. Catheterization of the right internal iliac artery with arteriogram 7. Catheterization of the right uterine artery with  arteriogram 8. Particle embolization of the right uterine artery to near stasis MEDICATIONS: 1 g Rocephin. The antibiotic was administered within 1 hour of the procedure. 30 mg Toradol also administered IV during the procedure. ANESTHESIA/SEDATION: Moderate (conscious) sedation was employed during this procedure. A total of Versed 6 mg and Fentanyl 200 mcg was administered intravenously. Moderate Sedation Time: 61 minutes. The patient's level of consciousness and vital signs were monitored continuously by radiology nursing throughout the procedure under my direct supervision. CONTRAST:  5mL OMNIPAQUE IOHEXOL 300 MG/ML SOLN, 44mL OMNIPAQUE IOHEXOL 300 MG/ML SOLN FLUOROSCOPY TIME:  Fluoroscopy Time: 13 minutes 36 seconds (1135 mGy). COMPLICATIONS: None immediate. PROCEDURE: Informed consent was obtained from the patient following explanation of the procedure, risks, benefits and alternatives. The patient understands, agrees and consents for the procedure. All questions were addressed. A time out was performed prior to the initiation of the procedure. Maximal barrier sterile technique utilized including caps, mask, sterile gowns, sterile gloves, large sterile drape, hand hygiene, and Betadine prep. The right common femoral artery was interrogated with ultrasound and found to be widely patent. An image was obtained and stored for the medical record. Local anesthesia was attained by infiltration with 1% lidocaine. A small dermatotomy was made. Under real-time sonographic guidance, the vessel was punctured with a 21 gauge micropuncture needle. Using standard technique, the initial micro needle was exchanged over a 0.018 micro wire for a transitional 4 [place] micro sheath. The micro sheath was then exchanged over a 0.035 wire for a 5 French vascular sheath. A C2 cobra catheter was advanced over a Bentson wire up in over the aortic bifurcation and into the left internal iliac artery. The catheter was flushed and capped.  With the catheter defining the aortic bifurcation and iliac arteries, attention was next turned toward performing the superior hypogastric nerve block. The image intensifier was angled in till the L5 vertebral body could be viewed en face. Local anesthesia was attained by infiltration with 1% lidocaine. A 22  gauge 15 cm Chiba needle was then advanced under real-time fluoroscopic guidance onto the anterior surface of the L5 vertebral body. A small amount of contrast was injected in the AP and lateral projections confirming that the needle tip was in the retroperitoneal pre-spinal space. There is no evidence of venous extravasation. Next, a total of 20 mL ropivacaine was slowly injected while maintaining forward pressure on the needle. Vital signs were carefully monitored. There is no evidence of intravascular injection. The patient tolerated the injection well. The needle was removed. Attention was again turned to the embolization portion of the procedure. An arteriogram was performed from the catheter within the left internal iliac artery. A hypertrophic uterine artery is readily identified. A renegade hi Flo microcatheter was successfully navigated over a Fathom 16 wire into the horizontal segment of the left uterine artery. Arteriography was performed confirming catheter position. Particle embolization was then performed using 1 vial of 500-700 micron embospheres and 2.5 vials of 700-900 micron embospheres. Post embolization contrast injection demonstrates an appropriate angiographic endpoint of near stasis. The microcatheter was removed. The 5 French C2 cobra catheter was then formed into a Waltman loop in used to select the ipsilateral right internal iliac artery. Arteriography was performed. Again, there is a hypertrophic uterine artery. The microcatheter was reintroduced over the Fathom wire and advanced into the horizontal segment of the right uterine artery. Arteriography was performed confirming  microcatheter position. Particle embolization was then performed using 1 vial of 500-700 micron embospheres and 2.25 vials of 700-900 micron embospheres. Post embolization contrast injection demonstrates an appropriate angiographic endpoint of near stasis. The catheters were removed. A limited right common femoral arteriogram was then performed through the sheath confirming common femoral arterial access. Hemostasis was attained with the assistance of a 6 French Angio-Seal device. IMPRESSION: 1. Successful fluoroscopically guided superior hypogastric nerve block. 2. Successful bilateral uterine artery embolization. Signed, Criselda Peaches, MD, [place] Electronically Signed   By: Jacqulynn Cadet M.D.   On: 01/13/2019 16:55   Ir Angiogram Selective Each Additional Vessel  Result Date: 01/13/2019 INDICATION: 38 year old female with uterine fibroids complicated by severe prolonged bleeding. She is currently admitted with symptomatic anemia. Her hemoglobin is less than 4 and she requires a multi unit transfusion. She has been highly resistant to hysterectomy. She presents now for superior hypogastric nerve block and bilateral uterine artery embolization in an effort to prevent further life-threatening blood-loss. EXAM: IR ULTRASOUND GUIDANCE VASC ACCESS RIGHT; IR FLUORO GUIDE NEEDLE PLACEMENT /BIOPSY; PELVIC SELECTIVE ARTERIOGRAPHY; IR EMBO TUMOR ORGAN ISCHEMIA INFARCT INC GUIDE ROADMAPPING; ADDITIONAL ARTERIOGRAPHY 1. Superior hypogastric nerve block under fluoroscopic guidance 2. Vascular access, right common femoral artery 3. Catheterization of the left internal iliac artery with arteriogram 4. Catheterization of the left uterine artery with arteriogram 5. Particle embolization of the left uterine artery to near stasis 6. Catheterization of the right internal iliac artery with arteriogram 7. Catheterization of the right uterine artery with  arteriogram 8. Particle embolization of the right uterine artery to near stasis MEDICATIONS: 1 g Rocephin. The antibiotic was administered within 1 hour of the procedure. 30 mg Toradol also administered IV during the procedure. ANESTHESIA/SEDATION: Moderate (conscious) sedation was employed during this procedure. A total of Versed 6 mg and Fentanyl 200 mcg was administered intravenously. Moderate Sedation Time: 61 minutes. The patient's level of consciousness and vital signs were monitored continuously by radiology nursing throughout the procedure under my direct supervision. CONTRAST:  32mL OMNIPAQUE IOHEXOL  300 MG/ML SOLN, 41mL OMNIPAQUE IOHEXOL 300 MG/ML SOLN FLUOROSCOPY TIME:  Fluoroscopy Time: 13 minutes 36 seconds (1135 mGy). COMPLICATIONS: None immediate. PROCEDURE: Informed consent was obtained from the patient following explanation of the procedure, risks, benefits and alternatives. The patient understands, agrees and consents for the procedure. All questions were addressed. A time out was performed prior to the initiation of the procedure. Maximal barrier sterile technique utilized including caps, mask, sterile gowns, sterile gloves, large sterile drape, hand hygiene, and Betadine prep. The right common femoral artery was interrogated with ultrasound and found to be widely patent. An image was obtained and stored for the medical record. Local anesthesia was attained by infiltration with 1% lidocaine. A small dermatotomy was made. Under real-time sonographic guidance, the vessel was punctured with a 21 gauge micropuncture needle. Using standard technique, the initial micro needle was exchanged over a 0.018 micro wire for a transitional 4 [place] micro sheath. The micro sheath was then exchanged over a 0.035 wire for a 5 French vascular sheath. A C2 cobra catheter was advanced over a Bentson wire up in over the aortic bifurcation and into the left internal iliac artery. The catheter was flushed and capped.  With the catheter defining the aortic bifurcation and iliac arteries, attention was next turned toward performing the superior hypogastric nerve block. The image intensifier was angled in till the L5 vertebral body could be viewed en face. Local anesthesia was attained by infiltration with 1% lidocaine. A 22 gauge 15 cm Chiba needle was then advanced under real-time fluoroscopic guidance onto the anterior surface of the L5 vertebral body. A small amount of contrast was injected in the AP and lateral projections confirming that the needle tip was in the retroperitoneal pre-spinal space. There is no evidence of venous extravasation. Next, a total of 20 mL ropivacaine was slowly injected while maintaining forward pressure on the needle. Vital signs were carefully monitored. There is no evidence of intravascular injection. The patient tolerated the injection well. The needle was removed. Attention was again turned to the embolization portion of the procedure. An arteriogram was performed from the catheter within the left internal iliac artery. A hypertrophic uterine artery is readily identified. A renegade hi Flo microcatheter was successfully navigated over a Fathom 16 wire into the horizontal segment of the left uterine artery. Arteriography was performed confirming catheter position. Particle embolization was then performed using 1 vial of 500-700 micron embospheres and 2.5 vials of 700-900 micron embospheres. Post embolization contrast injection demonstrates an appropriate angiographic endpoint of near stasis. The microcatheter was removed. The 5 French C2 cobra catheter was then formed into a Waltman loop in used to select the ipsilateral right internal iliac artery. Arteriography was performed. Again, there is a hypertrophic uterine artery. The microcatheter was reintroduced over the Fathom wire and advanced into the horizontal segment of the right uterine artery. Arteriography was performed confirming  microcatheter position. Particle embolization was then performed using 1 vial of 500-700 micron embospheres and 2.25 vials of 700-900 micron embospheres. Post embolization contrast injection demonstrates an appropriate angiographic endpoint of near stasis. The catheters were removed. A limited right common femoral arteriogram was then performed through the sheath confirming common femoral arterial access. Hemostasis was attained with the assistance of a 6 French Angio-Seal device. IMPRESSION: 1. Successful fluoroscopically guided superior hypogastric nerve block. 2. Successful bilateral uterine artery embolization. Signed, Criselda Peaches, MD, [place] [facility] Radiology Electronically Signed   By: Dellis Filbert.D.  On: 01/13/2019 16:55   Ir US Guide Vasc Access Right  Result Date: 01/13/2019 INDICATION: 38 year old female with uterine fibroids complicated by severe prolonged bleeding. She is currently admitted with symptomatic anemia. Her hemoglobin is less than 4 and she requires a multi unit transfusion. She has been highly resistant to hysterectomy. She presents now for superior hypogastric nerve block and bilateral uterine artery embolization in an effort to prevent further life-threatening blood-loss. EXAM: IR ULTRASOUND GUIDANCE VASC ACCESS RIGHT; IR FLUORO GUIDE NEEDLE PLACEMENT /BIOPSY; PELVIC SELECTIVE ARTERIOGRAPHY; IR EMBO TUMOR ORGAN ISCHEMIA INFARCT INC GUIDE ROADMAPPING; ADDITIONAL ARTERIOGRAPHY 1. Superior hypogastric nerve block under fluoroscopic guidance 2. Vascular access, right common femoral artery 3. Catheterization of the left internal iliac artery with arteriogram 4. Catheterization of the left uterine artery with arteriogram 5. Particle embolization of the left uterine artery to near stasis 6. Catheterization of the right internal iliac artery with arteriogram 7. Catheterization of the right uterine artery with arteriogram 8.  Particle embolization of the right uterine artery to near stasis MEDICATIONS: 1 g Rocephin. The antibiotic was administered within 1 hour of the procedure. 30 mg Toradol also administered IV during the procedure. ANESTHESIA/SEDATION: Moderate (conscious) sedation was employed during this procedure. A total of Versed 6 mg and Fentanyl 200 mcg was administered intravenously. Moderate Sedation Time: 61 minutes. The patient's level of consciousness and vital signs were monitored continuously by radiology nursing throughout the procedure under my direct supervision. CONTRAST:  103mL OMNIPAQUE IOHEXOL 300 MG/ML SOLN, 57mL OMNIPAQUE IOHEXOL 300 MG/ML SOLN FLUOROSCOPY TIME:  Fluoroscopy Time: 13 minutes 36 seconds (1135 mGy). COMPLICATIONS: None immediate. PROCEDURE: Informed consent was obtained from the patient following explanation of the procedure, risks, benefits and alternatives. The patient understands, agrees and consents for the procedure. All questions were addressed. A time out was performed prior to the initiation of the procedure. Maximal barrier sterile technique utilized including caps, mask, sterile gowns, sterile gloves, large sterile drape, hand hygiene, and Betadine prep. The right common femoral artery was interrogated with ultrasound and found to be widely patent. An image was obtained and stored for the medical record. Local anesthesia was attained by infiltration with 1% lidocaine. A small dermatotomy was made. Under real-time sonographic guidance, the vessel was punctured with a 21 gauge micropuncture needle. Using standard technique, the initial micro needle was exchanged over a 0.018 micro wire for a transitional 4 [place] micro sheath. The micro sheath was then exchanged over a 0.035 wire for a 5 French vascular sheath. A C2 cobra catheter was advanced over a Bentson wire up in over the aortic bifurcation and into the left internal iliac artery. The catheter was flushed and capped. With the catheter  defining the aortic bifurcation and iliac arteries, attention was next turned toward performing the superior hypogastric nerve block. The image intensifier was angled in till the L5 vertebral body could be viewed en face. Local anesthesia was attained by infiltration with 1% lidocaine. A 22 gauge 15 cm Chiba needle was then advanced under real-time fluoroscopic guidance onto the anterior surface of the L5 vertebral body. A small amount of contrast was injected in the AP and lateral projections confirming that the needle tip was in the retroperitoneal pre-spinal space. There is no evidence of venous extravasation. Next, a total of 20 mL ropivacaine was slowly injected while maintaining forward pressure on the needle. Vital signs were carefully monitored. There is no evidence of intravascular injection. The patient tolerated the injection well. The needle was removed. Attention was again  turned to the embolization portion of the procedure. An arteriogram was performed from the catheter within the left internal iliac artery. A hypertrophic uterine artery is readily identified. A renegade hi Flo microcatheter was successfully navigated over a Fathom 16 wire into the horizontal segment of the left uterine artery. Arteriography was performed confirming catheter position. Particle embolization was then performed using 1 vial of 500-700 micron embospheres and 2.5 vials of 700-900 micron embospheres. Post embolization contrast injection demonstrates an appropriate angiographic endpoint of near stasis. The microcatheter was removed. The 5 French C2 cobra catheter was then formed into a Waltman loop in used to select the ipsilateral right internal iliac artery. Arteriography was performed. Again, there is a hypertrophic uterine artery. The microcatheter was reintroduced over the Fathom wire and advanced into the horizontal segment of the right uterine artery. Arteriography was performed confirming microcatheter position.  Particle embolization was then performed using 1 vial of 500-700 micron embospheres and 2.25 vials of 700-900 micron embospheres. Post embolization contrast injection demonstrates an appropriate angiographic endpoint of near stasis. The catheters were removed. A limited right common femoral arteriogram was then performed through the sheath confirming common femoral arterial access. Hemostasis was attained with the assistance of a 6 French Angio-Seal device. IMPRESSION: 1. Successful fluoroscopically guided superior hypogastric nerve block. 2. Successful bilateral uterine artery embolization. Signed, Criselda Peaches, MD, [place] [facility] Radiology Electronically Signed   By: Jacqulynn Cadet M.D.   On: 01/13/2019 16:55   Ir Fluoro Guide Ndl Plmt / Bx  Result Date: 01/13/2019 INDICATION: 38 year old female with uterine fibroids complicated by severe prolonged bleeding. She is currently admitted with symptomatic anemia. Her hemoglobin is less than 4 and she requires a multi unit transfusion. She has been highly resistant to hysterectomy. She presents now for superior hypogastric nerve block and bilateral uterine artery embolization in an effort to prevent further life-threatening blood-loss. EXAM: IR ULTRASOUND GUIDANCE VASC ACCESS RIGHT; IR FLUORO GUIDE NEEDLE PLACEMENT /BIOPSY; PELVIC SELECTIVE ARTERIOGRAPHY; IR EMBO TUMOR ORGAN ISCHEMIA INFARCT INC GUIDE ROADMAPPING; ADDITIONAL ARTERIOGRAPHY 1. Superior hypogastric nerve block under fluoroscopic guidance 2. Vascular access, right common femoral artery 3. Catheterization of the left internal iliac artery with arteriogram 4. Catheterization of the left uterine artery with arteriogram 5. Particle embolization of the left uterine artery to near stasis 6. Catheterization of the right internal iliac artery with arteriogram 7. Catheterization of the right uterine artery with arteriogram 8. Particle embolization of  the right uterine artery to near stasis MEDICATIONS: 1 g Rocephin. The antibiotic was administered within 1 hour of the procedure. 30 mg Toradol also administered IV during the procedure. ANESTHESIA/SEDATION: Moderate (conscious) sedation was employed during this procedure. A total of Versed 6 mg and Fentanyl 200 mcg was administered intravenously. Moderate Sedation Time: 61 minutes. The patient's level of consciousness and vital signs were monitored continuously by radiology nursing throughout the procedure under my direct supervision. CONTRAST:  48mL OMNIPAQUE IOHEXOL 300 MG/ML SOLN, 12mL OMNIPAQUE IOHEXOL 300 MG/ML SOLN FLUOROSCOPY TIME:  Fluoroscopy Time: 13 minutes 36 seconds (1135 mGy). COMPLICATIONS: None immediate. PROCEDURE: Informed consent was obtained from the patient following explanation of the procedure, risks, benefits and alternatives. The patient understands, agrees and consents for the procedure. All questions were addressed. A time out was performed prior to the initiation of the procedure. Maximal barrier sterile technique utilized including caps, mask, sterile gowns, sterile gloves, large sterile drape, hand hygiene, and Betadine prep. The right common femoral artery was interrogated with  ultrasound and found to be widely patent. An image was obtained and stored for the medical record. Local anesthesia was attained by infiltration with 1% lidocaine. A small dermatotomy was made. Under real-time sonographic guidance, the vessel was punctured with a 21 gauge micropuncture needle. Using standard technique, the initial micro needle was exchanged over a 0.018 micro wire for a transitional 4 [place] micro sheath. The micro sheath was then exchanged over a 0.035 wire for a 5 French vascular sheath. A C2 cobra catheter was advanced over a Bentson wire up in over the aortic bifurcation and into the left internal iliac artery. The catheter was flushed and capped. With the catheter defining the aortic  bifurcation and iliac arteries, attention was next turned toward performing the superior hypogastric nerve block. The image intensifier was angled in till the L5 vertebral body could be viewed en face. Local anesthesia was attained by infiltration with 1% lidocaine. A 22 gauge 15 cm Chiba needle was then advanced under real-time fluoroscopic guidance onto the anterior surface of the L5 vertebral body. A small amount of contrast was injected in the AP and lateral projections confirming that the needle tip was in the retroperitoneal pre-spinal space. There is no evidence of venous extravasation. Next, a total of 20 mL ropivacaine was slowly injected while maintaining forward pressure on the needle. Vital signs were carefully monitored. There is no evidence of intravascular injection. The patient tolerated the injection well. The needle was removed. Attention was again turned to the embolization portion of the procedure. An arteriogram was performed from the catheter within the left internal iliac artery. A hypertrophic uterine artery is readily identified. A renegade hi Flo microcatheter was successfully navigated over a Fathom 16 wire into the horizontal segment of the left uterine artery. Arteriography was performed confirming catheter position. Particle embolization was then performed using 1 vial of 500-700 micron embospheres and 2.5 vials of 700-900 micron embospheres. Post embolization contrast injection demonstrates an appropriate angiographic endpoint of near stasis. The microcatheter was removed. The 5 French C2 cobra catheter was then formed into a Waltman loop in used to select the ipsilateral right internal iliac artery. Arteriography was performed. Again, there is a hypertrophic uterine artery. The microcatheter was reintroduced over the Fathom wire and advanced into the horizontal segment of the right uterine artery. Arteriography was performed confirming microcatheter position. Particle embolization was  then performed using 1 vial of 500-700 micron embospheres and 2.25 vials of 700-900 micron embospheres. Post embolization contrast injection demonstrates an appropriate angiographic endpoint of near stasis. The catheters were removed. A limited right common femoral arteriogram was then performed through the sheath confirming common femoral arterial access. Hemostasis was attained with the assistance of a 6 French Angio-Seal device. IMPRESSION: 1. Successful fluoroscopically guided superior hypogastric nerve block. 2. Successful bilateral uterine artery embolization. Signed, Criselda Peaches, MD, [place] [facility] Radiology Electronically Signed   By: Jacqulynn Cadet M.D.   On: 01/13/2019 16:55   Ir Embo Tumor Organ Ischemia Infarct Inc Guide Roadmapping  Result Date: 01/13/2019 INDICATION: 38 year old female with uterine fibroids complicated by severe prolonged bleeding. She is currently admitted with symptomatic anemia. Her hemoglobin is less than 4 and she requires a multi unit transfusion. She has been highly resistant to hysterectomy. She presents now for superior hypogastric nerve block and bilateral uterine artery embolization in an effort to prevent further life-threatening blood-loss. EXAM: IR ULTRASOUND GUIDANCE VASC ACCESS RIGHT; IR FLUORO GUIDE NEEDLE PLACEMENT /BIOPSY; PELVIC SELECTIVE ARTERIOGRAPHY;  IR EMBO TUMOR ORGAN ISCHEMIA INFARCT INC GUIDE ROADMAPPING; ADDITIONAL ARTERIOGRAPHY 1. Superior hypogastric nerve block under fluoroscopic guidance 2. Vascular access, right common femoral artery 3. Catheterization of the left internal iliac artery with arteriogram 4. Catheterization of the left uterine artery with arteriogram 5. Particle embolization of the left uterine artery to near stasis 6. Catheterization of the right internal iliac artery with arteriogram 7. Catheterization of the right uterine artery with arteriogram 8. Particle embolization  of the right uterine artery to near stasis MEDICATIONS: 1 g Rocephin. The antibiotic was administered within 1 hour of the procedure. 30 mg Toradol also administered IV during the procedure. ANESTHESIA/SEDATION: Moderate (conscious) sedation was employed during this procedure. A total of Versed 6 mg and Fentanyl 200 mcg was administered intravenously. Moderate Sedation Time: 61 minutes. The patient's level of consciousness and vital signs were monitored continuously by radiology nursing throughout the procedure under my direct supervision. CONTRAST:  30mL OMNIPAQUE IOHEXOL 300 MG/ML SOLN, 2mL OMNIPAQUE IOHEXOL 300 MG/ML SOLN FLUOROSCOPY TIME:  Fluoroscopy Time: 13 minutes 36 seconds (1135 mGy). COMPLICATIONS: None immediate. PROCEDURE: Informed consent was obtained from the patient following explanation of the procedure, risks, benefits and alternatives. The patient understands, agrees and consents for the procedure. All questions were addressed. A time out was performed prior to the initiation of the procedure. Maximal barrier sterile technique utilized including caps, mask, sterile gowns, sterile gloves, large sterile drape, hand hygiene, and Betadine prep. The right common femoral artery was interrogated with ultrasound and found to be widely patent. An image was obtained and stored for the medical record. Local anesthesia was attained by infiltration with 1% lidocaine. A small dermatotomy was made. Under real-time sonographic guidance, the vessel was punctured with a 21 gauge micropuncture needle. Using standard technique, the initial micro needle was exchanged over a 0.018 micro wire for a transitional 4 [place] micro sheath. The micro sheath was then exchanged over a 0.035 wire for a 5 French vascular sheath. A C2 cobra catheter was advanced over a Bentson wire up in over the aortic bifurcation and into the left internal iliac artery. The catheter was flushed and capped. With the catheter defining the aortic  bifurcation and iliac arteries, attention was next turned toward performing the superior hypogastric nerve block. The image intensifier was angled in till the L5 vertebral body could be viewed en face. Local anesthesia was attained by infiltration with 1% lidocaine. A 22 gauge 15 cm Chiba needle was then advanced under real-time fluoroscopic guidance onto the anterior surface of the L5 vertebral body. A small amount of contrast was injected in the AP and lateral projections confirming that the needle tip was in the retroperitoneal pre-spinal space. There is no evidence of venous extravasation. Next, a total of 20 mL ropivacaine was slowly injected while maintaining forward pressure on the needle. Vital signs were carefully monitored. There is no evidence of intravascular injection. The patient tolerated the injection well. The needle was removed. Attention was again turned to the embolization portion of the procedure. An arteriogram was performed from the catheter within the left internal iliac artery. A hypertrophic uterine artery is readily identified. A renegade hi Flo microcatheter was successfully navigated over a Fathom 16 wire into the horizontal segment of the left uterine artery. Arteriography was performed confirming catheter position. Particle embolization was then performed using 1 vial of 500-700 micron embospheres and 2.5 vials of 700-900 micron embospheres. Post embolization contrast injection demonstrates an appropriate angiographic endpoint of near stasis. The microcatheter was  removed. The 5 French C2 cobra catheter was then formed into a Waltman loop in used to select the ipsilateral right internal iliac artery. Arteriography was performed. Again, there is a hypertrophic uterine artery. The microcatheter was reintroduced over the Fathom wire and advanced into the horizontal segment of the right uterine artery. Arteriography was performed confirming microcatheter position. Particle embolization was  then performed using 1 vial of 500-700 micron embospheres and 2.25 vials of 700-900 micron embospheres. Post embolization contrast injection demonstrates an appropriate angiographic endpoint of near stasis. The catheters were removed. A limited right common femoral arteriogram was then performed through the sheath confirming common femoral arterial access. Hemostasis was attained with the assistance of a 6 French Angio-Seal device. IMPRESSION: 1. Successful fluoroscopically guided superior hypogastric nerve block. 2. Successful bilateral uterine artery embolization. Signed, Criselda Peaches, MD, [place] [facility] Radiology Electronically Signed   By: Jacqulynn Cadet M.D.   On: 01/13/2019 16:55       LAB RESULTS: Basic Metabolic Panel: Recent Labs  Lab 01/13/19 1153 01/14/19 0540 01/15/19 0455  NA  --  136 137  K  --  4.0 3.2*  CL  --  107 107  CO2  --  21* 22  GLUCOSE  --  125* 91  BUN  --  5* 10  CREATININE  --  0.53 0.60  CALCIUM  --  8.5* 8.4*  MG 1.8  --   --    Liver Function Tests: Recent Labs  Lab 01/14/19 0540  AST 14*  ALT 16  ALKPHOS 34*  BILITOT 1.2  PROT 6.0*  ALBUMIN 3.5   No results for input(s): LIPASE, AMYLASE in the last 168 hours. No results for input(s): AMMONIA in the last 168 hours. CBC: Recent Labs  Lab 01/14/19 0540 01/14/19 1756 01/15/19 0455  WBC 9.3  --  8.6  HGB 6.1* 8.6* 7.5*  HCT 19.6* 27.3* 24.3*  MCV 81.3  --  85.0  PLT 360  --  316   Cardiac Enzymes: No results for input(s): CKTOTAL, CKMB, CKMBINDEX, TROPONINI in the last 168 hours. BNP: Invalid input(s): POCBNP CBG: No results for input(s): GLUCAP in the last 168 hours.    Disposition and Follow-up: Discharge Instructions    Diet - low sodium heart healthy   Complete by: As directed    Increase activity slowly   Complete by: As directed        DISPOSITION: Register     Lafonda Mosses, MD Follow up in 1 week(s).   Specialty: Gynecologic Oncology Why: Please review instructions for preparation for your surgery on October 29th. Contact information: [place] [address] [phone]            Time coordinating discharge:  35 minutes  Signed:   Estill Cotta M.D. [facility] 01/15/2019, 12:12 PM

## 2019-01-15 NOTE — Discharge Instructions (Signed)
Myomectomy  Myomectomy is a surgery in which a non-cancerous fibroid (myoma) is removed from the uterus. Myomas are tumors made up of fibrous tissue. They are often called fibroid tumors. Fibroid tumors can range from the size of a pea to the size of a grapefruit. In a myomectomy, the fibroid tumor is removed without removing the uterus. Because these tumors are rarely cancerous, this surgery is usually done only if the tumor is growing or causing symptoms such as pain, pressure, bleeding, or pain with intercourse. Tell a health care provider about:  Any allergies you have.  All medicines you are taking, including vitamins, herbs, eye drops, creams, and over-the-counter medicines.  Any problems you or family members have had with anesthetic medicines.  Any blood disorders you have.  Any surgeries you have had.  Any medical conditions you have. What are the risks? Generally, this is a safe procedure. However, problems may occur, including:  Bleeding.  Infection.  Allergic reactions to medicines.  Damage to other structures or organs.  Blood clots in the legs, chest, or brain.  Scar tissue on other organs and in the pelvis. This may require another surgery to remove the scar tissue. What happens before the procedure? Staying hydrated Follow instructions from your health care provider about hydration, which may include:  Up to 2 hours before the procedure - you may continue to drink clear liquids, such as water, clear fruit juice, black coffee, and plain tea. Eating and drinking restrictions Follow instructions from your health care provider about eating and drinking, which may include:  8 hours before the procedure - stop eating heavy meals or foods such as meat, fried foods, or fatty foods.  6 hours before the procedure - stop eating light meals or foods, such as toast or cereal.  6 hours before the procedure - stop drinking milk or drinks that contain milk.  2 hours before  the procedure - stop drinking clear liquids. General instructions  Ask your health care provider about: ? Changing or stopping your regular medicines. This is especially important if you are taking diabetes medicines or blood thinners. ? Taking medicines such as aspirin and ibuprofen. These medicines can thin your blood. Do not take these medicines before your procedure if your health care provider instructs you not to.  Do not drink alcohol the day before the surgery.  Do not use any products that contain nicotine or tobacco, such as cigarettes and e-cigarettes, for 2 weeks before the procedure. If you need help quitting, ask your health care provider.  Plan to have someone take you home from the hospital or clinic. Also arrange for someone to help you with activities during your recovery. What happens during the procedure?  To reduce your risk of infection: ? Your health care team will wash or sanitize their hands. ? Your skin will be washed with soap. ? Hair may be removed from the surgical area.  An IV tube will be inserted into one of your veins. Medicines will be able to flow directly into your body through this IV tube.  You will be given one or more of the following: ? A medicine to help you relax (sedative). ? A medicine to make you fall asleep (general anesthetic).  Small monitors will be attached to your body. They will be used to check your heart, blood pressure, and oxygen level.  A breathing tube will be placed into your lungs during the procedure.  A thin, flexible tube (catheter) will be inserted  into your bladder to collect urine.  Your surgeon will use one of the following methods to perform the procedure. The method used will depend on the size, shape, location, and number of fibroids. Hysteroscopic myomectomy This method may be used when the fibroid tumor is inside the cavity of the uterus. A long, thin tube with a lens (hysteroscope) will be inserted into the  uterus through the vagina. A saline solution will be put into the uterus. This will expand the uterus and allow the surgeon to see the fibroids. Tools will be passed through the hysteroscope to remove the fibroid tumor in pieces. Laparoscopic myomectomy A few small incisions will be made in the lower abdomen. A thin, lighted tube with a camera (laparoscope) will be inserted through one of the incisions. This will give the surgeon a good view of the area. The fibroid tumor will be removed through the other incisions. The incisions will then be closed with stitches (sutures) or staples. Abdominal myomectomy This method is used when the fibroid tumor cannot be removed with a hysteroscope or laparoscope. The surgery will be done through a larger surgical incision in the abdomen. The fibroid tumor will be removed through this incision. The incision will be closed with sutures or staples. Recovery time will be longer if this method is used. The procedures may vary among health care providers and hospitals. What happens after the procedure?  Your blood pressure, heart rate, breathing rate, and blood oxygen level will be monitored until the medicines you were given have worn off.  The IV access tube and catheter will remain on your body for a period of time.  You may be given medicine for pain or to help you sleep.  You may be given an antibiotic medicine if needed.  Do not drive for 24 hours if you were given a sedative. Summary  Myomectomy is surgery to remove a noncancerous fibroid (myoma) from the uterus.  This surgery is usually done only if the tumor is growing or causing symptoms such as pain, pressure, bleeding, or pain during intercourse.  Follow instructions from your health care provider about eating and drinking before the procedure.  Recovery time from this procedure depends on the method. The abdominal method will require a longer recovery time. This information is not intended to  replace advice given to you by your health care provider. Make sure you discuss any questions you have with your health care provider. Document Released: 01/04/2007 Document Revised: 07/01/2018 Document Reviewed: 04/09/2016 Elsevier Patient Education  2020 [employer].   Preparing for your Surgery  Plan for surgery on 10/29 with Dr. Berline Lopes at [place] will be scheduled for an abdominal myomectomy, possible total abdominal hysterectomy and bilateral salpingectomy.   Pre-operative Testing -You will receive a phone call from presurgical testing at [facility] if you have not received a call already to arrange for a pre-operative testing appointment before your surgery.    -Bring your insurance card, copy of an advanced directive if applicable, medication list  -At that visit, you will be asked to sign a consent for a possible blood transfusion in case a transfusion becomes necessary during surgery.  The need for a blood transfusion is rare but having consent is a necessary part of your care.     -You should not be taking blood thinners or aspirin at least ten days prior to surgery unless instructed by your surgeon.  -As part of our enhanced surgical recovery pathway, you  may be advised to drink a carbohydrate drink the morning of surgery (at least 3 hours before). If you are diabetic, this will be substituted with G2 gatorade in order to prevent elevated glucose levels prior to surgery.  -Do not take supplements such as fish oil (omega 3), red yeast rice, tumeric before your surgery.  Day Before Surgery at [place] will be asked to take in a light diet the day before surgery.  Avoid carbonated beverages.  You will be advised to have nothing to eat or drink after midnight the evening before.    Eat a light diet the day before surgery.  Examples including soups, broths, toast, yogurt, mashed potatoes.  Things to avoid include carbonated beverages (fizzy beverages),  raw fruits and raw vegetables, or beans.   Your role in recovery Your role is to become active as soon as directed by your doctor, while still giving yourself time to heal.  Rest when you feel tired. You will be asked to do the following in order to speed your recovery:  - Cough and breathe deeply. This helps toclear and expand your lungs and can prevent pneumonia.  - Do mild physical activity. Walking or moving your legs help your circulation and body functions return to normal. A staff member will help you when you try to walk and will provide you with simple exercises. Do not try to get up or walk alone the first time. - Actively manage your pain. Managing your pain lets you move in comfort. We will ask you to rate your pain on a scale of zero to 10. It is your responsibility to tell your doctor or nurse where and how much you hurt so your pain can be treated.  Special Considerations -If you are diabetic, you may be placed on insulin after surgery to have closer control over your blood sugars to promote healing and recovery.  This does not mean that you will be discharged on insulin.  If applicable, your oral antidiabetics will be resumed when you are tolerating a solid diet.  -Your final pathology results from surgery should be available around one week after surgery and the results will be relayed to you when available.  -FMLA forms can be faxed to [phone] and please allow 5-7 business days for completion.  Pain Management After Surgery -You will be prescribed pain medication and bowel regimen medications to have available when you are discharged from the hospital.   -Make sure that you have Tylenol and Ibuprofen at home to use on a regular basis after surgery for pain control. We recommend alternating the medications every hour to six hours since they work differently and are processed in the body differently for pain relief.  -Review the attached handout on narcotic use and their  risks and side effects.   Bowel Regimen -You have been prescribed Sennakot-S to take nightly to prevent constipation especially if you are taking the narcotic pain medication intermittently.  It is important to prevent constipation and drink adequate amounts of liquids.

## 2019-01-15 NOTE — Progress Notes (Signed)
GYNECOLOGIC ONCOLOGY INPATIENT CONSULTATION  Date of Service: 01/15/19 Consulting Provider: Jeral Pinch, MD   SUBJECTIVE: Feels much better this morning.  Appetite is returning.  Denies any nausea.  Had some dizziness with ambulation to the nursing station yesterday, has ambulated to the bathroom multiple times since then with no dizziness.  Denies any emesis. Reports continued cramping and pelvic pain that is managed with medications.  Urinating without difficulty, reports flatus, denies bowel movement since admission.  Has had significant decrease in bleeding.  Overnight did not change chux.  Yesterday in the afternoon had a peripad on for 4 hours and when she changed it there was less than half saturation of the pad.  MEDICATIONS:  Current Facility-Administered Medications:  .  0.9 %  sodium chloride infusion, 250 mL, Intravenous, PRN, Jacqulynn Cadet, MD, Last Rate: 10 mL/hr at 01/15/19 0225 .  Chlorhexidine Gluconate Cloth 2 % PADS 6 each, 6 each, Topical, Daily, Georgette Shell, MD, 6 each at 01/14/19 [phone] .  diphenhydrAMINE (BENADRYL) capsule 25 mg, 25 mg, Oral, Q6H PRN, Jacqulynn Cadet, MD, 25 mg at 01/15/19 0535 .  docusate sodium (COLACE) capsule 100 mg, 100 mg, Oral, BID, Jacqulynn Cadet, MD, 100 mg at 01/14/19 2228 .  HYDROmorphone (DILAUDID) injection 1 mg, 1 mg, Intravenous, Q3H PRN, Jacqulynn Cadet, MD, 1 mg at 01/15/19 0535 .  LORazepam (ATIVAN) tablet 1 mg, 1 mg, Oral, Once, [place], [employer], PA-C .  naproxen sodium (ANAPROX) tablet 550 mg, 550 mg, Oral, BID WC, Jacqulynn Cadet, MD, 550 mg at 01/14/19 1647 .  ondansetron (ZOFRAN) injection 4 mg, 4 mg, Intravenous, Q6H PRN, Jacqulynn Cadet, MD .  oxyCODONE-acetaminophen (PERCOCET/ROXICET) 5-325 MG per tablet 1-2 tablet, 1-2 tablet, Oral, Q4H PRN, Jacqulynn Cadet, MD, 1 tablet at 01/13/19 1931 .  potassium chloride SA (KLOR-CON) CR tablet 40 mEq, 40 mEq, Oral, Once, Rai, Ripudeep K, MD .  promethazine  (PHENERGAN) tablet 25 mg, 25 mg, Oral, Q8H PRN **OR** promethazine (PHENERGAN) suppository 25 mg, 25 mg, Rectal, Q8H PRN, Jacqulynn Cadet, MD .  sodium chloride flush (NS) 0.9 % injection 3 mL, 3 mL, Intravenous, Q12H, Jacqulynn Cadet, MD, 3 mL at 01/14/19 0956 .  sodium chloride flush (NS) 0.9 % injection 3 mL, 3 mL, Intravenous, PRN, Jacqulynn Cadet, MD   Intake/Output Summary (Last 24 hours) at 01/15/2019 0802 Last data filed at 01/15/2019 0236 Gross per 24 hour  Intake 1309.01 ml  Output 1100 ml  Net 209.01 ml    PHYSICAL EXAM: BP 105/67 (BP Location: Right Arm)   Pulse 66   Temp 98 F (36.7 C) (Oral)   Resp 14   Ht 5\' 6"  (1.676 m)   Wt 165 lb (74.8 kg)   LMP 01/13/2019   SpO2 100%   BMI 26.63 kg/m  General: Alert, oriented, no acute distress. HEENT: Normocephalic, atraumatic.  Sclera anicteric, improved conjunctival pallor. Chest: Clear to auscultation bilaterally. Cardiovascular: Regular rate and rhythm, no murmurs, rubs, or gallops. Abdomen: Normoactive bowel sounds.  Soft, nondistended, mild to moderate tenderness to palpation in lower quadrants.  No evidence of hernia.  No palpable fluid wave.   Extremities: Grossly normal range of motion.  Warm, well perfused.  No edema bilaterally. Skin: No rashes or lesions.  LABORATORY AND RADIOLOGIC DATA: CBC    Component Value Date/Time   WBC 8.6 01/15/2019 0455   RBC 2.86 (L) 01/15/2019 0455   HGB 7.5 (L) 01/15/2019 0455   HGB 5.7 (LL) 01/02/2019 1531   HGB 6.1 (LL) 03/17/2018 1641  HCT 24.3 (L) 01/15/2019 0455   HCT 21.2 (L) 03/17/2018 1641   PLT 316 01/15/2019 0455   PLT 255 01/02/2019 1531   PLT 473 (H) 03/17/2018 1641   MCV 85.0 01/15/2019 0455   MCV 68 (L) 03/17/2018 1641   MCH 26.2 01/15/2019 0455   MCHC 30.9 01/15/2019 0455   RDW 18.9 (H) 01/15/2019 0455   RDW 17.0 (H) 03/17/2018 1641   LYMPHSABS 2.1 12/22/2018 1551   MONOABS 0.4 12/22/2018 1551   EOSABS 0.3 12/22/2018 1551   BASOSABS 0.0  12/22/2018 1551   CMP Latest Ref Rng & Units 01/15/2019 01/14/2019 01/13/2019  Glucose 70 - 99 mg/dL 91 125(H) 113(H)  BUN 6 - 20 mg/dL 10 5(L) 9  Creatinine 0.44 - 1.00 mg/dL 0.60 0.53 0.76  Sodium 135 - 145 mmol/L 137 136 135  Potassium 3.5 - 5.1 mmol/L 3.2(L) 4.0 3.3(L)  Chloride 98 - 111 mmol/L 107 107 106  CO2 22 - 32 mmol/L 22 21(L) 18(L)  Calcium 8.9 - 10.3 mg/dL 8.4(L) 8.5(L) 8.8(L)  Total Protein 6.5 - 8.1 g/dL - 6.0(L) -  Total Bilirubin 0.3 - 1.2 mg/dL - 1.2 -  Alkaline Phos 38 - 126 U/L - 34(L) -  AST 15 - 41 U/L - 14(L) -  ALT 0 - 44 U/L - 16 -     ASSESSMENT AND PLAN: Becky Larson is a 38 y.o. woman with profound symptomatic anemia secondary to large prolapsing fibroid now status post uterine artery embolization with significant improvement in her vaginal bleeding.  Patient feels significantly improved this morning after receiving 2 additional units of packed red blood cells yesterday.  Her hemoglobin last night was 8.6, down to 7.5 this morning.  I suspect that the 8.6 was artificially elevated yesterday given starting hemoglobin of 6.1 prior to her 2 units.  Given the significant decrease in her vaginal bleeding and overall hemodynamic stability, I think the patient is appropriate for discharge.  I gave her strict precautions to follow-up if she has any symptoms of anemia as she has recently had including dizziness, lightheadedness, shortness of breath or chest pain.  Also discussed precautions for bleeding.  I again spent a significant amount of time discussing definitive surgery with the patient.  She spoke with her husband yesterday and is ready to proceed with hysterectomy.  I had previously offered an attempt at myomectomy with the understanding that I think the success rate for this is extremely low.  The patient would like me to attempt myomectomy but voices understanding that she may need hysterectomy in any event.  She also voices understanding of my concern if  we do not proceed with surgery but understands that this is ultimately her choice.  She was ready to sign the hysterectomy statement consent form today which she did with a witness present.  The patient should expect a phone call from the presurgical clinic regarding Covid testing and perioperative instructions.  We have planned for her to be typed and crossed for 4 units on the day of surgery as well to have Cell Saver available.  The above assessment and plan was communicated to the patient's primary team.  Jeral Pinch MD Gynecologic Oncology [phone]

## 2019-01-16 ENCOUNTER — Other Ambulatory Visit: Payer: Self-pay | Admitting: Gynecologic Oncology

## 2019-01-16 ENCOUNTER — Telehealth: Payer: Self-pay | Admitting: Gynecologic Oncology

## 2019-01-16 DIAGNOSIS — R1030 Lower abdominal pain, unspecified: Secondary | ICD-10-CM | POA: Insufficient documentation

## 2019-01-16 MED ORDER — IBUPROFEN 600 MG PO TABS: 600.0000 mg | ORAL_TABLET | Freq: Four times a day (QID) | ORAL | 1 refills | Status: DC | PRN

## 2019-01-16 MED ORDER — OXYCODONE HCL 5 MG PO TABS: 5.0000 mg | ORAL_TABLET | Freq: Four times a day (QID) | ORAL | 0 refills | Status: DC | PRN

## 2019-01-16 NOTE — Telephone Encounter (Signed)
Called patient to follow-up recent discharge yesterday. She reports significant pelvic pain and cramping for which she is using 200mg  ibuprofen and tylenol. We reviewed that this is normal after [place]. I sent prescriptions for high-dose ibuprofen and oxycodone to her pharmacy. She has had very little vaginal bleeding since discharge.  Becky Larson voiced significant concern about surgery Thursday. While she has decided that she wants to proceed with hysterectomy, she feels that Thursday is "too early to go through another procedure when she is hurting so much after the last one." She is very afraid of undergoing surgery and has a "feeling that [she] is going to die during surgery" if she has surgery on Thursday.  We discussed that risks of waiting for surgery and the increased risk of having to perform an emergent hysterectomy if she were to come back with heavy bleeding again. She voices understanding of this and my recommendation to proceed with surgery on Thursday. She is amenable to thinking about this decision until tomorrow - I will call her in the morning to check in again.   Becky Pinch MD Gynecologic Oncology

## 2019-01-17 ENCOUNTER — Telehealth: Payer: Self-pay | Admitting: Gynecologic Oncology

## 2019-01-17 NOTE — Telephone Encounter (Signed)
I called the patient again this afternoon to discuss surgery on Thursday.  She had been sleeping this morning when I tried to call and apologize for missing my calls.  She continues to voice interest in moving forward with hysterectomy although still feels that this week is too soon.  We spoke briefly and then she called her husband before making her final decision to delay surgery.  She voices understanding that this is against my recommendation.  While her pain is much improved, she still feels like she is recovering from the uterine artery embolization in her hospitalization over the weekend.  I discussed that the combination of Lupron and her uterine artery embolization will hopefully both serve to decrease the bleeding that she has until surgery and Intra-Op.  I worry that if we delay surgery, she may have increased and heavy vaginal bleeding again and present emergently.  She understands to come in if she has any significant vaginal bleeding.  Currently, she continues to have just a streak on her pad when she changes it which is not very frequent.  This is a very significant decrease in bleeding from what has been her baseline recently.  I have voiced my concern multiple times that her surgery will be much more risky if she comes in bleeding and we have to perform a hysterectomy emergently given the difficulty anticipated with her surgery.  It may be that if the patient presents over the weekend that it would be safer to pack her vagina, start transfusing her and transfer to a hospital with a large blood bank and more resources over the weekend.  This will have to be something that is decided that the moment if she does present.  I offered to move the surgery to November 10.  The patient was amenable to this.  She will keep her visit on the second with me in person which we will use to review the surgery itself, answer any of her questions, and reiterate perioperative instructions.  All the patient's questions  were answered today.  Jeral Pinch MD Gynecologic Oncology

## 2019-01-17 NOTE — Telephone Encounter (Signed)
Called and left message x2 for Becky Larson this morning about surgery on Thursday. Despite my strong recommendation to proceed with planned hysterectomy, the patient voiced yesterday not feeling ready. We agreed that I would call her again this morning to make a final decision about surgery.  Jeral Pinch MD Gynecologic Oncology

## 2019-01-18 ENCOUNTER — Other Ambulatory Visit: Payer: Self-pay | Admitting: Gynecologic Oncology

## 2019-01-18 DIAGNOSIS — D250 Submucous leiomyoma of uterus: Secondary | ICD-10-CM

## 2019-01-18 DIAGNOSIS — D251 Intramural leiomyoma of uterus: Secondary | ICD-10-CM

## 2019-01-20 NOTE — Progress Notes (Deleted)
Gynecologic Oncology Return Clinic Visit  01/23/19   Reason for Visit: pre-operative visit, follow-up symptomatic anemia secondary to blood loss from large prolapsing uterine fibroid  Interval History: ***  Past Medical/Surgical History: Past Medical History:  Diagnosis Date  . Anemia    due to blood loss  . Dyspnea    due to exertion   . Ectopic pregnancy    Right  . Fibroid uterus     Past Surgical History:  Procedure Laterality Date  . CESAREAN SECTION    . HYSTEROSCOPY W/D&C N/A 01/05/2018   Procedure: DILATATION AND CURETTAGE /HYSTEROSCOPY/MYOMECTOMY/POLYPECTOMY;  Surgeon: Aletha Halim, MD;  Location: [place];  Service: Gynecology;  Laterality: N/A;  . IR ANGIOGRAM PELVIS SELECTIVE OR SUPRASELECTIVE  01/13/2019  . IR ANGIOGRAM PELVIS SELECTIVE OR SUPRASELECTIVE  01/13/2019  . IR ANGIOGRAM SELECTIVE EACH ADDITIONAL VESSEL  01/13/2019  . IR ANGIOGRAM SELECTIVE EACH ADDITIONAL VESSEL  01/13/2019  . IR EMBO TUMOR ORGAN ISCHEMIA INFARCT INC GUIDE ROADMAPPING  01/13/2019  . IR FLUORO GUIDED NEEDLE PLC ASPIRATION/INJECTION LOC  01/13/2019  . IR US GUIDE VASC ACCESS RIGHT  01/13/2019  . LAPAROSCOPIC SALPINGOOPHERECTOMY Right    ectopic    Family History  Problem Relation Age of Onset  . Hypertension Father   . Hypertension Paternal Grandfather   . Heart attack Paternal Grandfather   . Lung cancer Paternal Grandfather   . Colon cancer Maternal Grandmother   . Lung cancer Paternal Grandmother   . Lung cancer Maternal Grandfather   . Ovarian cancer Neg Hx   . Uterine cancer Neg Hx   . Cervical cancer Neg Hx   . Breast cancer Neg Hx     Social History   Socioeconomic History  . Marital status: Married    Spouse name: Not on file  . Number of children: Not on file  . Years of education: Not on file  . Highest education level: Not on file  Occupational History  . Not on file  Social Needs  . Financial resource strain: Not on file  . Food  insecurity    Worry: Not on file    Inability: Not on file  . Transportation needs    Medical: Not on file    Non-medical: Not on file  Tobacco Use  . Smoking status: Never Smoker  . Smokeless tobacco: Never Used  Substance and Sexual Activity  . Alcohol use: Yes    Comment: socially  . Drug use: Yes    Frequency: 7.0 times per week    Types: Marijuana    Comment: uses daily  . Sexual activity: Yes    Birth control/protection: None  Lifestyle  . Physical activity    Days per week: Not on file    Minutes per session: Not on file  . Stress: Not on file  Relationships  . Social Herbalist on phone: Not on file    Gets together: Not on file    Attends religious service: Not on file    Active member of club or organization: Not on file    Attends meetings of clubs or organizations: Not on file    Relationship status: Not on file  Other Topics Concern  . Not on file  Social History Narrative   Lives with husband and son. Currently in school for nursing.    Current Medications:  Current Outpatient Medications:  .  docusate sodium (COLACE) 100 MG capsule, Take 1 capsule (100 mg total) by mouth 2 (  two) times daily. For constipation, Disp: 60 capsule, Rfl: 2 .  ibuprofen (ADVIL) 600 MG tablet, Take 1 tablet (600 mg total) by mouth every 6 (six) hours as needed., Disp: 30 tablet, Rfl: 1 .  iron polysaccharides (NIFEREX) 150 MG capsule, Take 1 capsule (150 mg total) by mouth 2 (two) times daily., Disp: 60 capsule, Rfl: 3 .  Leuprolide Acetate (LUPRON DEPOT, 72-MONTH, IM), Inject 1 Dose into the muscle every 30 (thirty) days., Disp: , Rfl:  .  naproxen sodium (ALEVE) 220 MG tablet, Take 440 mg by mouth 2 (two) times daily as needed (chest pain)., Disp: , Rfl:  .  oxyCODONE (OXY IR/ROXICODONE) 5 MG immediate release tablet, Take 1 tablet (5 mg total) by mouth every 6 (six) hours as needed for severe pain., Disp: 10 tablet, Rfl: 0  Review of Symptoms: Complete 10-system  review is negative except ***as above in Interval History.  Physical Exam: LMP 01/13/2019  General: ***Alert, oriented, no acute distress. HEENT: ***Posterior oropharynx clear, sclera anicteric. Chest: ***Clear to auscultation bilaterally.  ***Port site clean. Cardiovascular: ***Regular rate and rhythm, no murmurs. Abdomen: ***Obese, soft, nontender.  Normoactive bowel sounds.  No masses or hepatosplenomegaly appreciated.  ***Well-healed scar. Extremities: ***Grossly normal range of motion.  Warm, well perfused.  No edema bilaterally. Skin: ***No rashes or lesions noted. Lymphatics: ***No cervical, supraclavicular, or inguinal adenopathy. GU: Normal appearing external genitalia without erythema, excoriation, or lesions.  Speculum exam reveals ***.  Bimanual exam reveals ***.  ***Rectovaginal exam  confirms ___.  Laboratory & Radiologic Studies: ***  Assessment & Plan: Becky Larson is a 38 y.o. woman with Stage *** who presents for ***.  Jeral Pinch, MD  Division of Gynecologic Oncology  Department of Obstetrics and Gynecology  [facility]

## 2019-01-23 ENCOUNTER — Ambulatory Visit: Payer: Medicaid Other

## 2019-01-23 ENCOUNTER — Inpatient Hospital Stay: Payer: Medicaid Other | Attending: Gynecologic Oncology | Admitting: Gynecologic Oncology

## 2019-01-23 ENCOUNTER — Telehealth: Payer: Self-pay | Admitting: *Deleted

## 2019-01-23 ENCOUNTER — Ambulatory Visit: Payer: Medicaid Other | Admitting: Gynecologic Oncology

## 2019-01-23 DIAGNOSIS — D250 Submucous leiomyoma of uterus: Secondary | ICD-10-CM | POA: Insufficient documentation

## 2019-01-23 DIAGNOSIS — D251 Intramural leiomyoma of uterus: Secondary | ICD-10-CM | POA: Insufficient documentation

## 2019-01-23 NOTE — Telephone Encounter (Signed)
Called and left the patient a message with the lab appt

## 2019-01-24 ENCOUNTER — Other Ambulatory Visit: Payer: Self-pay

## 2019-01-24 ENCOUNTER — Ambulatory Visit (HOSPITAL_COMMUNITY)
Admission: EM | Admit: 2019-01-24 | Discharge: 2019-01-26 | Disposition: A | Discharge status: Home / Self Care | Payer: Medicaid Other | Attending: Obstetrics & Gynecology | Admitting: Obstetrics & Gynecology

## 2019-01-24 ENCOUNTER — Other Ambulatory Visit: Payer: Medicaid Other

## 2019-01-24 ENCOUNTER — Telehealth: Payer: Self-pay | Admitting: Gynecologic Oncology

## 2019-01-24 ENCOUNTER — Encounter (HOSPITAL_COMMUNITY): Payer: Self-pay | Admitting: Emergency Medicine

## 2019-01-24 ENCOUNTER — Other Ambulatory Visit: Payer: Self-pay | Admitting: Gynecologic Oncology

## 2019-01-24 DIAGNOSIS — Z885 Allergy status to narcotic agent status: Secondary | ICD-10-CM | POA: Insufficient documentation

## 2019-01-24 DIAGNOSIS — D259 Leiomyoma of uterus, unspecified: Secondary | ICD-10-CM | POA: Diagnosis not present

## 2019-01-24 DIAGNOSIS — D250 Submucous leiomyoma of uterus: Secondary | ICD-10-CM

## 2019-01-24 DIAGNOSIS — Z20828 Contact with and (suspected) exposure to other viral communicable diseases: Secondary | ICD-10-CM | POA: Diagnosis not present

## 2019-01-24 DIAGNOSIS — R102 Pelvic and perineal pain: Secondary | ICD-10-CM | POA: Insufficient documentation

## 2019-01-24 DIAGNOSIS — Z79899 Other long term (current) drug therapy: Secondary | ICD-10-CM | POA: Diagnosis not present

## 2019-01-24 DIAGNOSIS — G8918 Other acute postprocedural pain: Secondary | ICD-10-CM

## 2019-01-24 DIAGNOSIS — D500 Iron deficiency anemia secondary to blood loss (chronic): Secondary | ICD-10-CM

## 2019-01-24 DIAGNOSIS — I959 Hypotension, unspecified: Secondary | ICD-10-CM | POA: Diagnosis not present

## 2019-01-24 DIAGNOSIS — R52 Pain, unspecified: Secondary | ICD-10-CM | POA: Diagnosis not present

## 2019-01-24 DIAGNOSIS — R110 Nausea: Secondary | ICD-10-CM | POA: Diagnosis not present

## 2019-01-24 LAB — CBC WITH DIFFERENTIAL/PLATELET
Abs Immature Granulocytes: 0.05 10*3/uL (ref 0.00–0.07)
Basophils Absolute: 0 10*3/uL (ref 0.0–0.1)
Basophils Relative: 0 %
Eosinophils Absolute: 0.1 10*3/uL (ref 0.0–0.5)
Eosinophils Relative: 1 %
HCT: 35.1 % — ABNORMAL LOW (ref 36.0–46.0)
Hemoglobin: 10.8 g/dL — ABNORMAL LOW (ref 12.0–15.0)
Immature Granulocytes: 0 %
Lymphocytes Relative: 11 %
Lymphs Abs: 1.3 10*3/uL (ref 0.7–4.0)
MCH: 28.1 pg (ref 26.0–34.0)
MCHC: 30.8 g/dL (ref 30.0–36.0)
MCV: 91.4 fL (ref 80.0–100.0)
Monocytes Absolute: 0.6 10*3/uL (ref 0.1–1.0)
Monocytes Relative: 5 %
Neutro Abs: 9.5 10*3/uL — ABNORMAL HIGH (ref 1.7–7.7)
Neutrophils Relative %: 83 %
Platelets: 323 10*3/uL (ref 150–400)
RBC: 3.84 MIL/uL — ABNORMAL LOW (ref 3.87–5.11)
RDW: 20 % — ABNORMAL HIGH (ref 11.5–15.5)
WBC: 11.6 10*3/uL — ABNORMAL HIGH (ref 4.0–10.5)
nRBC: 0 % (ref 0.0–0.2)

## 2019-01-24 LAB — BASIC METABOLIC PANEL
Anion gap: 9 (ref 5–15)
BUN: 10 mg/dL (ref 6–20)
CO2: 22 mmol/L (ref 22–32)
Calcium: 8.9 mg/dL (ref 8.9–10.3)
Chloride: 105 mmol/L (ref 98–111)
Creatinine, Ser: 0.63 mg/dL (ref 0.44–1.00)
GFR calc Af Amer: >60 mL/min (ref >60)
GFR calc non Af Amer: >60 mL/min (ref >60)
Glucose, Bld: 107 mg/dL — ABNORMAL HIGH (ref 70–99)
Potassium: 3.8 mmol/L (ref 3.5–5.1)
Sodium: 136 mmol/L (ref 135–145)

## 2019-01-24 MED ORDER — SODIUM CHLORIDE 0.9 % IV SOLN
INTRAVENOUS | Status: AC
  Administered 2019-01-25: via INTRAVENOUS

## 2019-01-24 MED ORDER — HYDROMORPHONE HCL 1 MG/ML IJ SOLN
0.5000 mg | INTRAMUSCULAR | Status: DC | PRN
  Administered 2019-01-25: 0.5 mg via INTRAVENOUS
  Filled 2019-01-24: qty 1

## 2019-01-24 MED ORDER — ONDANSETRON HCL 4 MG/2ML IJ SOLN: 4.0000 mg | Freq: Three times a day (TID) | INTRAMUSCULAR | Status: DC | PRN

## 2019-01-24 MED ORDER — HYDROMORPHONE HCL 1 MG/ML IJ SOLN
1.0000 mg | Freq: Once | INTRAMUSCULAR | Status: AC
  Administered 2019-01-24: 1 mg via INTRAVENOUS
  Filled 2019-01-24: qty 1

## 2019-01-24 NOTE — ED Triage Notes (Signed)
Patient with acute onset of pain. Patient post op problem after fibroid surgery.  Patient went to bathroom and had urge to push, now with tissue protruding from vaginal vault.  Patient with large fibroids.

## 2019-01-24 NOTE — ED Provider Notes (Signed)
[place] EMERGENCY DEPARTMENT Provider Note   CSN: [id number] Arrival date & time: 01/24/19  2150     History   Chief Complaint Chief Complaint  Patient presents with  . Post-op Problem    HPI Becky Larson is a 38 y.o. female.     Patient presents to the ED with a chief complaint of pelvic pain.  She is s/p 10 days uterine artery embolization for large uterine fibroids.  She is scheduled for hysterectomy on 11/10.  She was recently admitted for acute blood loss and needed blood transfusion.  She is here tonight complaining of large prolapsed uterine fibroid.  She denies any additional bleeding.  She states pain is uncontrolled with her home pain medicine.   The history is provided by the patient. No language interpreter was used.    Past Medical History:  Diagnosis Date  . Anemia    due to blood loss  . Dyspnea    due to exertion   . Ectopic pregnancy    Right  . Fibroid uterus     Patient Active Problem List   Diagnosis Date Noted  . Lower abdominal pain 01/16/2019  . Acute blood loss anemia 01/14/2019  . Anemia 01/13/2019  . Hypokalemia 01/13/2019  . Sinus tachycardia 01/13/2019  . Abnormal uterine bleeding due to leiomyoma of uterus   . Intramural and submucous leiomyoma of uterus 01/02/2019  . Iron deficiency anemia due to chronic blood loss 01/02/2019  . Endometrial mass 12/22/2018  . Symptomatic anemia 12/22/2018  . Anemia associated with acute blood loss 03/18/2018  . Menorrhagia 03/18/2018  . 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 CESAREAN SECTION    . HYSTEROSCOPY W/D&C N/A 01/05/2018   Procedure: DILATATION AND CURETTAGE /HYSTEROSCOPY/MYOMECTOMY/POLYPECTOMY;  Surgeon: Aletha Halim, MD;  Location: [place];  Service: Gynecology;  Laterality: N/A;  . IR ANGIOGRAM PELVIS SELECTIVE OR SUPRASELECTIVE  01/13/2019  . IR ANGIOGRAM PELVIS  SELECTIVE OR SUPRASELECTIVE  01/13/2019  . IR ANGIOGRAM SELECTIVE EACH ADDITIONAL VESSEL  01/13/2019  . IR ANGIOGRAM SELECTIVE EACH ADDITIONAL VESSEL  01/13/2019  . IR EMBO TUMOR ORGAN ISCHEMIA INFARCT INC GUIDE ROADMAPPING  01/13/2019  . IR FLUORO GUIDED NEEDLE PLC ASPIRATION/INJECTION LOC  01/13/2019  . IR US GUIDE VASC ACCESS RIGHT  01/13/2019  . LAPAROSCOPIC SALPINGOOPHERECTOMY Right    ectopic     OB History    Gravida  7   Para  2   Term  2   Preterm  0   AB  5   Living  2     SAB  3   TAB  2   Ectopic  0   Multiple  0   Live Births  2            Home Medications    Prior to Admission medications   Medication Sig Start Date End Date Taking? Authorizing Provider  docusate sodium (COLACE) 100 MG capsule Take 1 capsule (100 mg total) by mouth 2 (two) times daily. For constipation 01/15/19   Rai, Vernelle Emerald, MD  ibuprofen (ADVIL) 600 MG tablet Take 1 tablet (600 mg total) by mouth every 6 (six) hours as needed. 01/16/19   Lafonda Mosses, MD  iron polysaccharides (NIFEREX) 150 MG capsule Take 1 capsule (150 mg total) by mouth 2 (two) times daily. 01/15/19   Rai, Vernelle Emerald, MD  Leuprolide Acetate (LUPRON DEPOT, 55-MONTH, IM) Inject 1  Dose into the muscle every 30 (thirty) days.    [provider]  naproxen sodium (ALEVE) 220 MG tablet Take 440 mg by mouth 2 (two) times daily as needed (chest pain).    [provider]  oxyCODONE (OXY IR/ROXICODONE) 5 MG immediate release tablet Take 1 tablet (5 mg total) by mouth every 6 (six) hours as needed for severe pain. 01/16/19   Lafonda Mosses, MD    Family History Family History  Problem Relation Age of Onset  . Hypertension Father   . Hypertension Paternal Grandfather   . Heart attack Paternal Grandfather   . Lung cancer Paternal Grandfather   . Colon cancer Maternal Grandmother   . Lung cancer Paternal Grandmother   . Lung cancer Maternal Grandfather   . Ovarian cancer Neg Hx   .  Uterine cancer Neg Hx   . Cervical cancer Neg Hx   . Breast cancer Neg Hx     Social History Social History   Tobacco Use  . Smoking status: Never Smoker  . Smokeless tobacco: Never Used  Substance Use Topics  . Alcohol use: Yes    Comment: socially  . Drug use: Yes    Frequency: 7.0 times per week    Types: Marijuana    Comment: uses daily     Allergies   Morphine and related   Review of Systems Review of Systems  All other systems reviewed and are negative.    Physical Exam Updated Vital Signs BP 107/66 (BP Location: Right Arm)   Pulse 80   Temp 98.8 F (37.1 C) (Oral)   Resp 16   Ht 5\' 6"  (1.676 m)   Wt 74.8 kg   LMP 01/13/2019   SpO2 96%   BMI 26.63 kg/m   Physical Exam Vitals signs and nursing note reviewed.  Constitutional:      General: She is not in acute distress.    Appearance: She is well-developed.  HENT:     Head: Normocephalic and atraumatic.  Eyes:     Conjunctiva/sclera: Conjunctivae normal.  Neck:     Musculoskeletal: Neck supple.  Cardiovascular:     Rate and Rhythm: Normal rate and regular rhythm.     Heart sounds: No murmur.  Pulmonary:     Effort: Pulmonary effort is normal. No respiratory distress.     Breath sounds: Normal breath sounds.  Abdominal:     Palpations: Abdomen is soft.     Tenderness: There is no abdominal tenderness.  Genitourinary:    Comments: Large prolapsed fibroid Musculoskeletal: Normal range of motion.  Skin:    General: Skin is warm and dry.  Neurological:     Mental Status: She is alert and oriented to person, place, and time.  Psychiatric:        Mood and Affect: Mood normal.        Behavior: Behavior normal.      ED Treatments / Results  Labs (all labs ordered are listed, but only abnormal results are displayed) Labs Reviewed  CBC WITH DIFFERENTIAL/PLATELET  BASIC METABOLIC PANEL    EKG None  Radiology No results found.  Procedures Procedures (including critical care  time)  Medications Ordered in ED Medications  HYDROmorphone (DILAUDID) injection 1 mg (has no administration in time range)     Initial Impression / Assessment and Plan / ED Course  I have reviewed the triage vital signs and the nursing notes.  Pertinent labs & imaging results that were available during my care of  the patient were reviewed by me and considered in my medical decision making (see chart for details).        Patient with prolapsed uterine fibroid.  In significant pain.  Seen by and discussed with Dr. Darl Householder, who recommends consult with GYN-ONC.    10:52 PM Appreciate Dr. Delsa Sale for coming to see the patient.  Anticipate admission.  11:55 PM Patient seen by and discussed with DR. Delsa Sale, recommends admit to [facility].  NPO.   Final Clinical Impressions(s) / ED Diagnoses   Final diagnoses:  Post-operative pain    ED Discharge Orders    None       Montine Circle, PA-C 01/24/19 2355    Drenda Freeze, MD 01/28/19 1220

## 2019-01-24 NOTE — H&P (Signed)
Becky Larson is an 38 y.o. female.   Chief Complaint: Prolapsing vaginal mass HPI: The patient carries a diagnosis of uterine fibroids.  A pelvic MRI in 9/20 showed an enlarged uterus with diffuse fibroid involvement and a sagittal diameter of 15+ cm.  A large, intracavitary lesion was described, prolapsing through the cervix.  She was recently admitted with a large prolapsing fibroid with vaginal hemorrhage, profound anemia.  She is s/p [place] with control of the bleeding.  She presents today with the mass filling the vagina to the introitus.  Past Medical History:  Diagnosis Date  . Anemia    due to blood loss  . Dyspnea    due to exertion   . Ectopic pregnancy    Right  . Fibroid uterus     Past Surgical History:  Procedure Laterality Date  . CESAREAN SECTION    . HYSTEROSCOPY W/D&C N/A 01/05/2018   Procedure: DILATATION AND CURETTAGE /HYSTEROSCOPY/MYOMECTOMY/POLYPECTOMY;  Surgeon: Aletha Halim, MD;  Location: [place];  Service: Gynecology;  Laterality: N/A;  . IR ANGIOGRAM PELVIS SELECTIVE OR SUPRASELECTIVE  01/13/2019  . IR ANGIOGRAM PELVIS SELECTIVE OR SUPRASELECTIVE  01/13/2019  . IR ANGIOGRAM SELECTIVE EACH ADDITIONAL VESSEL  01/13/2019  . IR ANGIOGRAM SELECTIVE EACH ADDITIONAL VESSEL  01/13/2019  . IR EMBO TUMOR ORGAN ISCHEMIA INFARCT INC GUIDE ROADMAPPING  01/13/2019  . IR FLUORO GUIDED NEEDLE PLC ASPIRATION/INJECTION LOC  01/13/2019  . IR US GUIDE VASC ACCESS RIGHT  01/13/2019  . LAPAROSCOPIC SALPINGOOPHERECTOMY Right    ectopic    Family History  Problem Relation Age of Onset  . Hypertension Father   . Hypertension Paternal Grandfather   . Heart attack Paternal Grandfather   . Lung cancer Paternal Grandfather   . Colon cancer Maternal Grandmother   . Lung cancer Paternal Grandmother   . Lung cancer Maternal Grandfather   . Ovarian cancer Neg Hx   . Uterine cancer Neg Hx   . Cervical cancer Neg Hx   . Breast cancer Neg Hx    Social  History:  reports that she has never smoked. She has never used smokeless tobacco. She reports current alcohol use. She reports current drug use. Frequency: 7.00 times per week. Drug: Marijuana.  Allergies:  Allergies  Allergen Reactions  . Morphine And Related Itching    respritatory issues as well    (Not in a hospital admission)   Results for orders placed or performed during the hospital encounter of 01/24/19 (from the past 48 hour(s))  CBC with Differential/Platelet     Status: Abnormal   Collection Time: 01/24/19 10:17 PM  Result Value Ref Range   WBC 11.6 (H) 4.0 - 10.5 K/uL   RBC 3.84 (L) 3.87 - 5.11 MIL/uL   Hemoglobin 10.8 (L) 12.0 - 15.0 g/dL   HCT 35.1 (L) 36.0 - 46.0 %   MCV 91.4 80.0 - 100.0 fL   MCH 28.1 26.0 - 34.0 pg   MCHC 30.8 30.0 - 36.0 g/dL   RDW 20.0 (H) 11.5 - 15.5 %   Platelets 323 150 - 400 K/uL   nRBC 0.0 0.0 - 0.2 %   Neutrophils Relative % 83 %   Neutro Abs 9.5 (H) 1.7 - 7.7 K/uL   Lymphocytes Relative 11 %   Lymphs Abs 1.3 0.7 - 4.0 K/uL   Monocytes Relative 5 %   Monocytes Absolute 0.6 0.1 - 1.0 K/uL   Eosinophils Relative 1 %   Eosinophils Absolute 0.1 0.0 - 0.5 K/uL  Basophils Relative 0 %   Basophils Absolute 0.0 0.0 - 0.1 K/uL   Immature Granulocytes 0 %   Abs Immature Granulocytes 0.05 0.00 - 0.07 K/uL    Comment: Performed at [facility], [place] [address] [place], [place] [id number]  Basic metabolic panel     Status: Abnormal   Collection Time: 01/24/19 10:17 PM  Result Value Ref Range   Sodium 136 135 - 145 mmol/L   Potassium 3.8 3.5 - 5.1 mmol/L   Chloride 105 98 - 111 mmol/L   CO2 22 22 - 32 mmol/L   Glucose, Bld 107 (H) 70 - 99 mg/dL   BUN 10 6 - 20 mg/dL   Creatinine, Ser 0.63 0.44 - 1.00 mg/dL   Calcium 8.9 8.9 - 10.3 mg/dL   GFR calc non Af Amer >60 >60 mL/min   GFR calc Af Amer >60 >60 mL/min   Anion gap 9 5 - 15    Comment: Performed at [facility], [place] [address] [place],  [address]   No results  found.  Review of Systems  Constitutional: Negative for chills and fever.  Genitourinary:       Pelvic pain    Blood pressure 107/66, pulse 80, temperature 98.8 F (37.1 C), temperature source Oral, resp. rate 16, height 5\' 6"  (1.676 m), weight 74.8 kg, last menstrual period 01/13/2019, SpO2 96 %. Physical Exam  Constitutional: She appears well-developed and well-nourished. No distress.  Cardiovascular: Normal rate and regular rhythm.  Respiratory: Breath sounds normal.  GI: Soft.  Genitourinary:        Assessment/Plan Large, prolapsing fibroid w/pelvic pain.  Minimal bleeding; hgb stable  >admit for pain control >possible candidate for vaginal myomectomy  Lahoma Crocker, MD 01/24/2019, 11:53 PM

## 2019-01-24 NOTE — Telephone Encounter (Signed)
Called and spoke with the patient as she missed her preop appointment yesterday.  She reports overall doing very well.  She has had some foul-smelling tissue from her vagina since the procedure and was somewhat worried she might have an infection.  She denies any associated vaginal discharge, fevers, chills, nausea or emesis.  She denies any vaginal bleeding since I last spoke with her.  She reports regular bowel function.  She is taking iron and folic acid daily.  She is hesitant to get about surgery next week.  I asked if she be willing to come into clinic for [place] to repeat a CBC, perform an exam and discuss surgery.  She is amenable to this and will come in at 1230 tomorrow.  I suspect that the foul-smelling tissue is related to necrotic tissue that has died after the uterine artery embolization.  KTucker MD

## 2019-01-25 ENCOUNTER — Encounter (HOSPITAL_COMMUNITY): Payer: Self-pay | Admitting: Gynecologic Oncology

## 2019-01-25 ENCOUNTER — Encounter (HOSPITAL_COMMUNITY)
Admission: EM | Disposition: A | Discharge status: Home / Self Care | Payer: Self-pay | Source: Home / Self Care | Attending: Emergency Medicine

## 2019-01-25 ENCOUNTER — Inpatient Hospital Stay: Payer: Medicaid Other | Admitting: Gynecologic Oncology

## 2019-01-25 ENCOUNTER — Inpatient Hospital Stay (HOSPITAL_COMMUNITY): Payer: Medicaid Other | Admitting: Certified Registered"

## 2019-01-25 ENCOUNTER — Inpatient Hospital Stay: Payer: Medicaid Other

## 2019-01-25 DIAGNOSIS — R102 Pelvic and perineal pain: Secondary | ICD-10-CM | POA: Diagnosis not present

## 2019-01-25 DIAGNOSIS — Z79899 Other long term (current) drug therapy: Secondary | ICD-10-CM | POA: Diagnosis not present

## 2019-01-25 DIAGNOSIS — D251 Intramural leiomyoma of uterus: Secondary | ICD-10-CM

## 2019-01-25 DIAGNOSIS — Z20828 Contact with and (suspected) exposure to other viral communicable diseases: Secondary | ICD-10-CM | POA: Diagnosis not present

## 2019-01-25 DIAGNOSIS — Z885 Allergy status to narcotic agent status: Secondary | ICD-10-CM | POA: Diagnosis not present

## 2019-01-25 DIAGNOSIS — R279 Unspecified lack of coordination: Secondary | ICD-10-CM | POA: Diagnosis not present

## 2019-01-25 DIAGNOSIS — D250 Submucous leiomyoma of uterus: Secondary | ICD-10-CM | POA: Diagnosis not present

## 2019-01-25 DIAGNOSIS — R5381 Other malaise: Secondary | ICD-10-CM | POA: Diagnosis not present

## 2019-01-25 DIAGNOSIS — R0689 Other abnormalities of breathing: Secondary | ICD-10-CM | POA: Diagnosis not present

## 2019-01-25 DIAGNOSIS — D259 Leiomyoma of uterus, unspecified: Secondary | ICD-10-CM | POA: Diagnosis not present

## 2019-01-25 DIAGNOSIS — Z743 Need for continuous supervision: Secondary | ICD-10-CM | POA: Diagnosis not present

## 2019-01-25 DIAGNOSIS — D62 Acute posthemorrhagic anemia: Secondary | ICD-10-CM | POA: Diagnosis not present

## 2019-01-25 HISTORY — PX: MYOMECTOMY: SHX85

## 2019-01-25 LAB — CBC WITH DIFFERENTIAL/PLATELET
Abs Immature Granulocytes: 0.03 10*3/uL (ref 0.00–0.07)
Basophils Absolute: 0 10*3/uL (ref 0.0–0.1)
Basophils Relative: 0 %
Eosinophils Absolute: 0.1 10*3/uL (ref 0.0–0.5)
Eosinophils Relative: 1 %
HCT: 36.3 % (ref 36.0–46.0)
Hemoglobin: 11.1 g/dL — ABNORMAL LOW (ref 12.0–15.0)
Immature Granulocytes: 0 %
Lymphocytes Relative: 13 %
Lymphs Abs: 1.2 10*3/uL (ref 0.7–4.0)
MCH: 27.5 pg (ref 26.0–34.0)
MCHC: 30.6 g/dL (ref 30.0–36.0)
MCV: 90.1 fL (ref 80.0–100.0)
Monocytes Absolute: 0.5 10*3/uL (ref 0.1–1.0)
Monocytes Relative: 5 %
Neutro Abs: 7.3 10*3/uL (ref 1.7–7.7)
Neutrophils Relative %: 81 %
Platelets: 328 10*3/uL (ref 150–400)
RBC: 4.03 MIL/uL (ref 3.87–5.11)
RDW: 20 % — ABNORMAL HIGH (ref 11.5–15.5)
WBC: 9.1 10*3/uL (ref 4.0–10.5)
nRBC: 0 % (ref 0.0–0.2)

## 2019-01-25 LAB — SURGICAL PCR SCREEN
MRSA, PCR: NEGATIVE
Staphylococcus aureus: POSITIVE — AB

## 2019-01-25 LAB — TYPE AND SCREEN
ABO/RH(D): O POS
Antibody Screen: NEGATIVE

## 2019-01-25 LAB — SARS CORONAVIRUS 2 BY RT PCR (HOSPITAL ORDER, PERFORMED IN ~~LOC~~ HOSPITAL LAB): SARS Coronavirus 2: NEGATIVE

## 2019-01-25 LAB — PREPARE RBC (CROSSMATCH)

## 2019-01-25 LAB — PREGNANCY, URINE: Preg Test, Ur: NEGATIVE

## 2019-01-25 LAB — ABO/RH: ABO/RH(D): O POS

## 2019-01-25 SURGERY — MYOMECTOMY, UTERUS, VAGINAL APPROACH
Anesthesia: General

## 2019-01-25 MED ORDER — HYDROMORPHONE HCL 1 MG/ML IJ SOLN: 0.2500 mg | INTRAMUSCULAR | Status: DC | PRN

## 2019-01-25 MED ORDER — POLYSACCHARIDE IRON COMPLEX 150 MG PO CAPS
150.0000 mg | ORAL_CAPSULE | Freq: Two times a day (BID) | ORAL | Status: DC
  Administered 2019-01-25 – 2019-01-26 (×2): 150 mg via ORAL
  Filled 2019-01-25 (×2): qty 1

## 2019-01-25 MED ORDER — ACETAMINOPHEN 500 MG PO TABS
1000.0000 mg | ORAL_TABLET | ORAL | Status: AC
  Administered 2019-01-25: 1000 mg via ORAL
  Filled 2019-01-25: qty 2

## 2019-01-25 MED ORDER — OXYCODONE HCL 5 MG/5ML PO SOLN: 5.0000 mg | Freq: Once | ORAL | Status: DC | PRN

## 2019-01-25 MED ORDER — ONDANSETRON HCL 4 MG PO TABS: 4.0000 mg | ORAL_TABLET | Freq: Four times a day (QID) | ORAL | Status: DC | PRN

## 2019-01-25 MED ORDER — HYDROMORPHONE HCL 1 MG/ML IJ SOLN
0.5000 mg | INTRAMUSCULAR | Status: DC | PRN
  Administered 2019-01-25 (×3): 0.5 mg via INTRAVENOUS
  Filled 2019-01-25 (×2): qty 0.5
  Filled 2019-01-25: qty 1

## 2019-01-25 MED ORDER — MIDAZOLAM HCL 2 MG/2ML IJ SOLN
INTRAMUSCULAR | Status: AC
  Filled 2019-01-25: qty 2

## 2019-01-25 MED ORDER — SUGAMMADEX SODIUM 200 MG/2ML IV SOLN
INTRAVENOUS | Status: DC | PRN
  Administered 2019-01-25: 200 mg via INTRAVENOUS

## 2019-01-25 MED ORDER — DEXAMETHASONE SODIUM PHOSPHATE 10 MG/ML IJ SOLN
INTRAMUSCULAR | Status: AC
  Filled 2019-01-25: qty 1

## 2019-01-25 MED ORDER — PROPOFOL 10 MG/ML IV BOLUS
INTRAVENOUS | Status: AC
  Filled 2019-01-25: qty 20

## 2019-01-25 MED ORDER — OXYCODONE HCL 5 MG PO TABS: 5.0000 mg | ORAL_TABLET | Freq: Once | ORAL | Status: DC | PRN

## 2019-01-25 MED ORDER — CEFAZOLIN SODIUM-DEXTROSE 2-4 GM/100ML-% IV SOLN
2.0000 g | INTRAVENOUS | Status: AC
  Administered 2019-01-25: 14:00:00 2 g via INTRAVENOUS
  Filled 2019-01-25: qty 100

## 2019-01-25 MED ORDER — ALUM & MAG HYDROXIDE-SIMETH 200-200-20 MG/5ML PO SUSP: 30.0000 mL | ORAL | Status: DC | PRN

## 2019-01-25 MED ORDER — ONDANSETRON HCL 4 MG/2ML IJ SOLN
INTRAMUSCULAR | Status: AC
  Filled 2019-01-25: qty 2

## 2019-01-25 MED ORDER — LACTATED RINGERS IV SOLN
INTRAVENOUS | Status: DC | PRN
  Administered 2019-01-25: 15:00:00 via INTRAVENOUS

## 2019-01-25 MED ORDER — PROPOFOL 10 MG/ML IV BOLUS
INTRAVENOUS | Status: DC | PRN
  Administered 2019-01-25: 150 mg via INTRAVENOUS
  Administered 2019-01-25: 50 mg via INTRAVENOUS

## 2019-01-25 MED ORDER — KCL IN DEXTROSE-NACL 20-5-0.45 MEQ/L-%-% IV SOLN
INTRAVENOUS | Status: DC
  Administered 2019-01-25: 10:00:00 via INTRAVENOUS
  Filled 2019-01-25: qty 1000

## 2019-01-25 MED ORDER — LIDOCAINE 2% (20 MG/ML) 5 ML SYRINGE
INTRAMUSCULAR | Status: DC | PRN
  Administered 2019-01-25: 40 mg via INTRAVENOUS

## 2019-01-25 MED ORDER — SODIUM CHLORIDE 0.9 % IR SOLN
Status: DC | PRN
  Administered 2019-01-25: 1000 mL

## 2019-01-25 MED ORDER — SUGAMMADEX SODIUM 200 MG/2ML IV SOLN: INTRAVENOUS | Status: DC | PRN

## 2019-01-25 MED ORDER — KETOROLAC TROMETHAMINE 30 MG/ML IJ SOLN: 30.0000 mg | Freq: Once | INTRAMUSCULAR | Status: DC

## 2019-01-25 MED ORDER — SODIUM CHLORIDE 0.9 % IV SOLN: INTRAVENOUS | Status: DC | PRN

## 2019-01-25 MED ORDER — SODIUM CHLORIDE 0.9 % IV SOLN
INTRAVENOUS | Status: DC
  Filled 2019-01-25: qty 30

## 2019-01-25 MED ORDER — DOCUSATE SODIUM 100 MG PO CAPS
100.0000 mg | ORAL_CAPSULE | Freq: Two times a day (BID) | ORAL | Status: DC
  Administered 2019-01-25 – 2019-01-26 (×2): 100 mg via ORAL
  Filled 2019-01-25 (×2): qty 1

## 2019-01-25 MED ORDER — ACETAMINOPHEN 500 MG PO TABS
1000.0000 mg | ORAL_TABLET | Freq: Four times a day (QID) | ORAL | Status: DC
  Administered 2019-01-26 (×3): 1000 mg via ORAL
  Filled 2019-01-25 (×3): qty 2

## 2019-01-25 MED ORDER — GABAPENTIN 300 MG PO CAPS
300.0000 mg | ORAL_CAPSULE | ORAL | Status: AC
  Administered 2019-01-25: 300 mg via ORAL
  Filled 2019-01-25: qty 1

## 2019-01-25 MED ORDER — DEXAMETHASONE SODIUM PHOSPHATE 10 MG/ML IJ SOLN
INTRAMUSCULAR | Status: DC | PRN
  Administered 2019-01-25: 8 mg via INTRAVENOUS

## 2019-01-25 MED ORDER — ROCURONIUM BROMIDE 10 MG/ML (PF) SYRINGE
PREFILLED_SYRINGE | INTRAVENOUS | Status: DC | PRN
  Administered 2019-01-25: 80 mg via INTRAVENOUS

## 2019-01-25 MED ORDER — MIDAZOLAM HCL 2 MG/2ML IJ SOLN
INTRAMUSCULAR | Status: DC | PRN
  Administered 2019-01-25: 2 mg via INTRAVENOUS

## 2019-01-25 MED ORDER — CELECOXIB 200 MG PO CAPS
400.0000 mg | ORAL_CAPSULE | ORAL | Status: AC
  Administered 2019-01-25: 400 mg via ORAL
  Filled 2019-01-25: qty 2

## 2019-01-25 MED ORDER — SCOPOLAMINE 1 MG/3DAYS TD PT72
1.0000 | MEDICATED_PATCH | TRANSDERMAL | Status: DC
  Administered 2019-01-25: 1.5 mg via TRANSDERMAL
  Filled 2019-01-25: qty 1

## 2019-01-25 MED ORDER — PROMETHAZINE HCL 25 MG/ML IJ SOLN: 6.2500 mg | INTRAMUSCULAR | Status: DC | PRN

## 2019-01-25 MED ORDER — ONDANSETRON HCL 4 MG/2ML IJ SOLN: 4.0000 mg | Freq: Four times a day (QID) | INTRAMUSCULAR | Status: DC | PRN

## 2019-01-25 MED ORDER — LIDOCAINE HCL (PF) 2 % IJ SOLN
INTRAMUSCULAR | Status: AC
  Filled 2019-01-25: qty 10

## 2019-01-25 MED ORDER — FENTANYL CITRATE (PF) 250 MCG/5ML IJ SOLN
INTRAMUSCULAR | Status: AC
  Filled 2019-01-25: qty 5

## 2019-01-25 MED ORDER — LIDOCAINE 2% (20 MG/ML) 5 ML SYRINGE
INTRAMUSCULAR | Status: AC
  Filled 2019-01-25: qty 5

## 2019-01-25 MED ORDER — ONDANSETRON HCL 4 MG/2ML IJ SOLN
INTRAMUSCULAR | Status: DC | PRN
  Administered 2019-01-25: 4 mg via INTRAVENOUS

## 2019-01-25 MED ORDER — ROCURONIUM BROMIDE 10 MG/ML (PF) SYRINGE
PREFILLED_SYRINGE | INTRAVENOUS | Status: AC
  Filled 2019-01-25: qty 10

## 2019-01-25 MED ORDER — OXYCODONE HCL 5 MG PO TABS: 5.0000 mg | ORAL_TABLET | ORAL | Status: DC | PRN

## 2019-01-25 MED ORDER — LIDOCAINE 2% (20 MG/ML) 5 ML SYRINGE
INTRAMUSCULAR | Status: DC | PRN
  Administered 2019-01-25: 1.2 mg/kg/h via INTRAVENOUS

## 2019-01-25 MED ORDER — LACTATED RINGERS IV SOLN
INTRAVENOUS | Status: DC
  Administered 2019-01-25: 13:00:00 via INTRAVENOUS

## 2019-01-25 MED ORDER — DEXAMETHASONE SODIUM PHOSPHATE 4 MG/ML IJ SOLN: 4.0000 mg | INTRAMUSCULAR | Status: DC

## 2019-01-25 MED ORDER — FENTANYL CITRATE (PF) 250 MCG/5ML IJ SOLN
INTRAMUSCULAR | Status: DC | PRN
  Administered 2019-01-25: 100 ug via INTRAVENOUS
  Administered 2019-01-25 (×2): 50 ug via INTRAVENOUS

## 2019-01-25 SURGICAL SUPPLY — 30 items
CLOTH BEACON ORANGE TIMEOUT ST (SAFETY) ×3 IMPLANT
COVER SURGICAL LIGHT HANDLE (MISCELLANEOUS) ×3 IMPLANT
COVER WAND RF STERILE (DRAPES) IMPLANT
DECANTER SPIKE VIAL GLASS SM (MISCELLANEOUS) ×1 IMPLANT
DRAPE HYSTEROSCOPY (DRAPE) ×2 IMPLANT
DRAPE STERI URO 9X17 APER PCH (DRAPES) ×3 IMPLANT
ENDOLOOP SUT PDS II  0 18 (SUTURE) ×4
ENDOLOOP SUT PDS II 0 18 (SUTURE) IMPLANT
GAUZE 4X4 16PLY RFD (DISPOSABLE) ×2 IMPLANT
GLOVE BIOGEL PI IND STRL 7.0 (GLOVE) ×1 IMPLANT
GLOVE BIOGEL PI INDICATOR 7.0 (GLOVE) ×2
GLOVE ECLIPSE 6.5 STRL STRAW (GLOVE) ×3 IMPLANT
GOWN STRL REUS W/TWL XL LVL3 (GOWN DISPOSABLE) ×9 IMPLANT
KIT TURNOVER KIT A (KITS) IMPLANT
LIGASURE IMPACT 36 18CM CVD LR (INSTRUMENTS) ×1 IMPLANT
MANIFOLD NEPTUNE II (INSTRUMENTS) ×3 IMPLANT
NDL SPNL 22GX3.5 QUINCKE BK (NEEDLE) ×1 IMPLANT
NEEDLE SPNL 22GX3.5 QUINCKE BK (NEEDLE) ×3 IMPLANT
PACK GENERAL/GYN (CUSTOM PROCEDURE TRAY) ×3 IMPLANT
PAD OB MATERNITY 4.3X12.25 (PERSONAL CARE ITEMS) ×3 IMPLANT
SHEARS HARMONIC 9CM CVD (BLADE) ×2 IMPLANT
SPONGE LAP 4X18 RFD (DISPOSABLE) ×1 IMPLANT
SUT VIC AB 0 CT1 18XCR BRD 8 (SUTURE) IMPLANT
SUT VIC AB 0 CT1 36 (SUTURE) ×2 IMPLANT
SUT VIC AB 0 CT1 8-18 (SUTURE)
SYR 10ML LL (SYRINGE) ×2 IMPLANT
TOWEL OR 17X26 10 PK STRL BLUE (TOWEL DISPOSABLE) ×6 IMPLANT
TRAY FOLEY MTR SLVR 14FR STAT (SET/KITS/TRAYS/PACK) ×2 IMPLANT
TRAY FOLEY MTR SLVR 16FR STAT (SET/KITS/TRAYS/PACK) ×1 IMPLANT
WATER STERILE IRR 1000ML POUR (IV SOLUTION) ×3 IMPLANT

## 2019-01-25 NOTE — Op Note (Signed)
PATIENT: Becky Larson DATE: 01/25/19   Preop Diagnosis: Prolapsing necrotic uterine fibroid  Postoperative Diagnosis: same as above  Surgery: Partial vaginal myomectomy  Surgeons:  Jeral Pinch, MD Assistant: Joylene John, NP  Anesthesia: General   Estimated blood loss: 68ml  IVF: see anesthesia records  Urine output: 5 ml   Complications: None   Pathology: uterine fibroid  Operative findings: Large, gelatinous uterine fibroid prolapsing through the introitus 3-4 centimeters. No solid components remaining of fibroid. Cervix dilated 4-5cm with 3-4cm stalk noted prolapsing through the cervix. Uterus overall 12cm in size and mobile.   Procedure: The patient was identified in the preoperative holding area. Informed consent was signed on the chart. Patient was seen history was reviewed and exam was performed.   The patient was then taken to the operating room and placed in the supine position with SCD hose on. General anesthesia was then induced without difficulty. She was then placed in the dorsolithotomy position. The perineum was prepped with Betadine. The vagina was prepped with Betadine around the fibroid. The patient was then draped after the prep was dried. A foley catheter was placed to empty the bladder and performed under aseptic conditions.  Timeout was performed the patient, procedure, antibiotic, allergy, and length of procedure.   The necrotic fibroid was grasped with a ring forcep and 0 PDS on an Endostitch was placed around the distal fibroid and guided up to the level of the cervix where it was tightened. A second Endostitch of PDS was placed to around the stalk of the fibroid. The handheld Harmonic was then used to cauterize and transect slowly across the stalk of the fibroid at the level of the cervix. This was done slowly, assuring hemostasis maintained. Once transected, the necrotic fibroid was handed off the field. The visible 3-4cm stalk was noted  within the cervix. The stalk was regrasped with rings and a tenaculum and an additional 1x2cm portion of the stalk was excised using the handheld Harmonic. Both PDS stitches were excised during this process. Monopolar electrocautery with the Bovie was used to achieve hemostasis along the posterior lip of the dilated cervix (mildly hyperemic and oozing). Hemostasis was noted of the stalk. Exam at the end of the procedure finds the distal stalk now just within the superior cervix. Given inability to visualize adequately to remove more (unable to pass anything more than a finger around the stalk and into the uterus given cervix clamped down around the stalk), known broad base of the stalk, and concern for bleeding, the procedure was terminated.  The foley catheter was removed.   All instrument, suture, laparotomy, Ray-Tec, and needle counts were correct x2. The patient tolerated the procedure well and was taken recovery room in stable condition.   Jeral Pinch MD Gynecologic Oncology

## 2019-01-25 NOTE — Anesthesia Procedure Notes (Signed)
Procedure Name: Intubation Date/Time: 01/25/2019 2:27 PM Performed by: Eben Burow, CRNA Pre-anesthesia Checklist: Patient identified, Emergency Drugs available, Suction available, Patient being monitored and Timeout performed Patient Re-evaluated:Patient Re-evaluated prior to induction Oxygen Delivery Method: Circle system utilized Preoxygenation: Pre-oxygenation with 100% oxygen Induction Type: IV induction Ventilation: Mask ventilation without difficulty Laryngoscope Size: Mac and 4 Grade View: Grade II Tube type: Oral Tube size: 7.0 mm Number of attempts: 1 Airway Equipment and Method: Stylet Placement Confirmation: ETT inserted through vocal cords under direct vision,  positive ETCO2 and breath sounds checked- equal and bilateral Secured at: 21 cm Tube secured with: Tape Dental Injury: Teeth and Oropharynx as per pre-operative assessment

## 2019-01-25 NOTE — Interval H&P Note (Signed)
History and Physical Interval Note:  01/25/2019 11:57 AM  Becky Larson  has presented today for surgery, with the diagnosis of fibroid.  The various methods of treatment have been discussed with the patient and family. After consideration of risks, benefits and other options for treatment, the patient has consented to  Procedure(s): VAGINAL MYOMECTOMY WITH POSSIBLE HYSTERECTOMY (N/A) as a surgical intervention.  The patient's history has been reviewed, patient examined, no change in status, stable for surgery.  I have reviewed the patient's chart and labs.  Questions were answered to the patient's satisfaction.     Lafonda Mosses

## 2019-01-25 NOTE — ED Triage Notes (Signed)
Updated report called to [phone].

## 2019-01-25 NOTE — Progress Notes (Signed)
GYNECOLOGIC ONCOLOGY INPATIENT PROGRESS NOTE  Date of Service: 01/25/19  SUBJECTIVE: The patient is overall doing well.  She notes continued discomfort secondary to prolapsing and necrotic fibroid.  She reports a good appetite but has not had anything to eat or drink since she presented to the emergency department last night.  She has had normal bowel function and reports voiding without difficulty multiple times overnight.  She denies any vaginal bleeding.  She denies any fevers or chills, nausea or emesis.   MEDICATIONS:  Current Facility-Administered Medications:  .  0.9 %  sodium chloride infusion, , Intravenous, PRN, Cross, Melissa D, NP .  dextrose 5 % and 0.45 % NaCl with KCl 20 mEq/L infusion, , Intravenous, Continuous, Cross, Melissa D, NP, Last Rate: 115 mL/hr at 01/25/19 1022 .  heparin 30,000 units/NS 1000 mL solution for CELLSAVER, , Other, To OR, Lafonda Mosses, MD .  HYDROmorphone (DILAUDID) injection 0.5 mg, 0.5 mg, Intravenous, Q2H PRN, Montine Circle, PA-C, 0.5 mg at 01/25/19 1024 .  ondansetron (ZOFRAN) injection 4 mg, 4 mg, Intravenous, Q8H PRN, Montine Circle, PA-C  I&O:  Intake/Output Summary (Last 24 hours) at 01/25/2019 1115 Last data filed at 01/25/2019 0856 Gross per 24 hour  Intake 2000 ml  Output -  Net 2000 ml    PHYSICAL EXAM: BP 125/80 (BP Location: Left Arm)   Pulse (!) 101   Temp 98.1 F (36.7 C) (Oral)   Resp 16   Ht 5\' 6"  (1.676 m)   Wt 165 lb (74.8 kg)   LMP 01/13/2019   SpO2 100%   BMI 26.63 kg/m  General: Alert, oriented, no acute distress. HEENT: Normocephalic, atraumatic.  Sclera anicteric. Chest: Clear to auscultation bilaterally. Cardiovascular: Regular rate (90s) and rhythm, no murmurs, rubs, or gallops. Abdomen: Normoactive bowel sounds.  Soft, nondistended, mildly tender to palpation in lower quadrants.  No masses or hepatosplenomegaly appreciated.  No evidence of hernia.  No palpable fluid wave.   Extremities: Grossly  normal range of motion.  Warm, well perfused.  No edema bilaterally. Skin: No rashes or lesions. GU: External genitalia without lesions.  Gelatinous, soft and necrotic mass prolapsing through the vaginal introitus several centimeters.  Somewhat foul-smelling due to necrosis.  No obvious purulent or foul-smelling discharge.  No bleeding.  Unable to assess vaginal portion of prolapsing fibroid secondary to patient's discomfort.  Deferred to the operating room.  LABORATORY AND RADIOLOGIC DATA: CBC    Component Value Date/Time   WBC 9.1 01/25/2019 0706   RBC 4.03 01/25/2019 0706   HGB 11.1 (L) 01/25/2019 0706   HGB 5.7 (LL) 01/02/2019 1531   HGB 6.1 (LL) 03/17/2018 1641   HCT 36.3 01/25/2019 0706   HCT 21.2 (L) 03/17/2018 1641   PLT 328 01/25/2019 0706   PLT 255 01/02/2019 1531   PLT 473 (H) 03/17/2018 1641   MCV 90.1 01/25/2019 0706   MCV 68 (L) 03/17/2018 1641   MCH 27.5 01/25/2019 0706   MCHC 30.6 01/25/2019 0706   RDW 20.0 (H) 01/25/2019 0706   RDW 17.0 (H) 03/17/2018 1641   LYMPHSABS 1.2 01/25/2019 0706   MONOABS 0.5 01/25/2019 0706   EOSABS 0.1 01/25/2019 0706   BASOSABS 0.0 01/25/2019 0706   CMP Latest Ref Rng & Units 01/24/2019 01/15/2019 01/14/2019  Glucose 70 - 99 mg/dL 107(H) 91 125(H)  BUN 6 - 20 mg/dL 10 10 5(L)  Creatinine 0.44 - 1.00 mg/dL 0.63 0.60 0.53  Sodium 135 - 145 mmol/L 136 137 136  Potassium 3.5 -  5.1 mmol/L 3.8 3.2(L) 4.0  Chloride 98 - 111 mmol/L 105 107 107  CO2 22 - 32 mmol/L 22 22 21(L)  Calcium 8.9 - 10.3 mg/dL 8.9 8.4(L) 8.5(L)  Total Protein 6.5 - 8.1 g/dL - - 6.0(L)  Total Bilirubin 0.3 - 1.2 mg/dL - - 1.2  Alkaline Phos 38 - 126 U/L - - 34(L)  AST 15 - 41 U/L - - 14(L)  ALT 0 - 44 U/L - - 16    ASSESSMENT AND PLAN: Becky Larson is a 38 y.o. woman with a known large prolapsing uterine fibroid who was recently admitted secondary to severe symptomatic anemia in the setting of acute on chronic blood loss now status post 1 dose of  Depo-Lupron as well as a uterine artery embolization presenting with acute worsening of prolapse of her now necrotic fibroid.  The patient's exam is significantly different from my last exam in clinic.  The fibroid itself is quite gelatinous and foul-smelling, suggesting significant necrosis.  It had previously been prolapsing through the cervix but was not palpable within the distal 4 to 5 cm of the patient's vaginal canal.  It now is protruding several centimeters from the vaginal introitus.  Due to her discomfort in the size of the fibroid, I am unable to tell how big the stalk is that it is prolapsing on or how dilated the cervix is.  I will be able to better assess this in the operating room.  Overall, the patient is hemodynamically stable and doing well.  Her hemoglobin is remarkably higher than during her last admission at just over 11.  Her white count was mildly elevated last night and after fluid resuscitation is normal this morning.  I suspect that the foul smell is related to necrosis and not infection although I am concerned that without intervention, she may develop infection of this prolapsing necrotic fibroid.  I strongly recommended to the patient moving forward with myomectomy.  She continues to strongly desire uterine preservation if possible.  Thus, I will attempt a vaginal myomectomy if I am able to remove a sufficient amount of the necrotic fibroid so as to avoid future infection or the small albeit present risk of uterine prolapse secondary to the weight of the fibroid.  I think this is less likely due to the inferior location of the base of the fibroid on imaging.  Been very clear with the patient that if I am able to perform a vaginal myomectomy safely, then this is what I will do.  If this is not possible, then we have discussed that I will perform an exploratory laparotomy with an attempt at an abdominal myomectomy.  If it is unsafe to proceed with solely myomectomy or the patient has  significant blood loss, then I will move forward with a total hysterectomy including removal of the cervix, uterus, and bilateral fallopian tubes.  Her husband was present for much of this discussion.  I spoke with the operating room this morning.  The patient is able to be an add-on case for the early afternoon.  She will remain n.p.o. with IV fluids.  Jeral Pinch MD Gynecologic Oncology [phone]

## 2019-01-25 NOTE — Anesthesia Postprocedure Evaluation (Signed)
Anesthesia Post Note  Patient: Becky Larson  Procedure(s) Performed: VAGINAL MYOMECTOMY (N/A )     Patient location during evaluation: PACU Anesthesia Type: General Level of consciousness: awake and alert Pain management: pain level controlled Vital Signs Assessment: post-procedure vital signs reviewed and stable Respiratory status: spontaneous breathing, nonlabored ventilation, respiratory function stable and patient connected to nasal cannula oxygen Cardiovascular status: blood pressure returned to baseline and stable Postop Assessment: no apparent nausea or vomiting Anesthetic complications: no    Last Vitals:  Vitals:   01/25/19 1632 01/25/19 1736  BP: 137/85 121/78  Pulse: 93 75  Resp: 16 16  Temp: 36.9 C 37.1 C  SpO2: 100% 99%    Last Pain:  Vitals:   01/25/19 1736  TempSrc: Oral  PainSc: 0-No pain                 Kaelen Caughlin P Lylee Corrow

## 2019-01-25 NOTE — ED Notes (Signed)
ED TO INPATIENT HANDOFF REPORT  ED Nurse Name and Phone #:  S Name/Age/Gender Becky Larson 38 y.o. female Room/Bed: 012C/012C  Code Status   Code Status: Prior  Home/SNF/Other Home Patient oriented to: self, place, time and situation Is this baseline? Yes   Triage Complete: Triage complete  Chief Complaint recent surgery for uterine fibroids  Triage Note Patient with acute onset of pain. Patient post op problem after fibroid surgery.  Patient went to bathroom and had urge to push, now with tissue protruding from vaginal vault.  Patient with large fibroids.   Allergies Allergies  Allergen Reactions  . Morphine And Related Itching    respritatory issues as well    Level of Care/Admitting Diagnosis ED Disposition    ED Disposition Condition [facility] Area: [place] [[id number]]  Level of Care: Med-Surg [16]  Covid Evaluation: Asymptomatic Screening Protocol (No Symptoms)  Diagnosis: Fibroid uterus [id number]  Admitting Physician: [place], [place]  Attending Physician: Lahoma Crocker [[id number]]  Estimated length of stay: past midnight tomorrow  Certification:: I certify this patient will need inpatient services for at least 2 midnights  Bed request comments: 5W- gyn/onc  PT Class (Do Not Modify): Inpatient [101]  PT Acc Code (Do Not Modify): Private [1]       B Medical/Surgery History Past Medical History:  Diagnosis Date  . Anemia    due to blood loss  . Dyspnea    due to exertion   . Ectopic pregnancy    Right  . Fibroid uterus    Past Surgical History:  Procedure Laterality Date  . CESAREAN SECTION    . HYSTEROSCOPY W/D&C N/A 01/05/2018   Procedure: DILATATION AND CURETTAGE /HYSTEROSCOPY/MYOMECTOMY/POLYPECTOMY;  Surgeon: Aletha Halim, MD;  Location: [facility];  Service: Gynecology;  Laterality: N/A;  . IR ANGIOGRAM PELVIS SELECTIVE OR SUPRASELECTIVE  01/13/2019  . IR ANGIOGRAM PELVIS  SELECTIVE OR SUPRASELECTIVE  01/13/2019  . IR ANGIOGRAM SELECTIVE EACH ADDITIONAL VESSEL  01/13/2019  . IR ANGIOGRAM SELECTIVE EACH ADDITIONAL VESSEL  01/13/2019  . IR EMBO TUMOR ORGAN ISCHEMIA INFARCT INC GUIDE ROADMAPPING  01/13/2019  . IR FLUORO GUIDED NEEDLE PLC ASPIRATION/INJECTION LOC  01/13/2019  . IR US GUIDE VASC ACCESS RIGHT  01/13/2019  . LAPAROSCOPIC SALPINGOOPHERECTOMY Right    ectopic     A IV Location/Drains/Wounds Patient Lines/Drains/Airways Status   Active Line/Drains/Airways    Name:   Placement date:   Placement time:   Site:   Days:   Peripheral IV 01/25/19 Right Hand   01/25/19    0813    Hand   less than 1   Incision (Closed) 01/05/18 Perineum   01/05/18    0758     385          Intake/Output Last 24 hours  Intake/Output Summary (Last 24 hours) at 01/25/2019 0857 Last data filed at 01/25/2019 0856 Gross per 24 hour  Intake 2000 ml  Output -  Net 2000 ml    Labs/Imaging Results for orders placed or performed during the hospital encounter of 01/24/19 (from the past 48 hour(s))  CBC with Differential/Platelet     Status: Abnormal   Collection Time: 01/24/19 10:17 PM  Result Value Ref Range   WBC 11.6 (H) 4.0 - 10.5 K/uL   RBC 3.84 (L) 3.87 - 5.11 MIL/uL   Hemoglobin 10.8 (L) 12.0 - 15.0 g/dL   HCT 35.1 (L) 36.0 - 46.0 %   MCV 91.4 80.0 -  100.0 fL   MCH 28.1 26.0 - 34.0 pg   MCHC 30.8 30.0 - 36.0 g/dL   RDW 20.0 (H) 11.5 - 15.5 %   Platelets 323 150 - 400 K/uL   nRBC 0.0 0.0 - 0.2 %   Neutrophils Relative % 83 %   Neutro Abs 9.5 (H) 1.7 - 7.7 K/uL   Lymphocytes Relative 11 %   Lymphs Abs 1.3 0.7 - 4.0 K/uL   Monocytes Relative 5 %   Monocytes Absolute 0.6 0.1 - 1.0 K/uL   Eosinophils Relative 1 %   Eosinophils Absolute 0.1 0.0 - 0.5 K/uL   Basophils Relative 0 %   Basophils Absolute 0.0 0.0 - 0.1 K/uL   Immature Granulocytes 0 %   Abs Immature Granulocytes 0.05 0.00 - 0.07 K/uL    Comment: Performed at [facility] [address], [place], [place] [id number]  Basic metabolic panel     Status: Abnormal   Collection Time: 01/24/19 10:17 PM  Result Value Ref Range   Sodium 136 135 - 145 mmol/L   Potassium 3.8 3.5 - 5.1 mmol/L   Chloride 105 98 - 111 mmol/L   CO2 22 22 - 32 mmol/L   Glucose, Bld 107 (H) 70 - 99 mg/dL   BUN 10 6 - 20 mg/dL   Creatinine, Ser 0.63 0.44 - 1.00 mg/dL   Calcium 8.9 8.9 - 10.3 mg/dL   GFR calc non Af Amer >60 >60 mL/min   GFR calc Af Amer >60 >60 mL/min   Anion gap 9 5 - 15    Comment: Performed at [facility], [place] [address] [place], [place] [address]  SARS Coronavirus 2 by RT PCR (hospital order, performed in [facility] hospital lab) Nasopharyngeal Nasopharyngeal Swab     Status: None   Collection Time: 01/24/19 11:51 PM   Specimen: Nasopharyngeal Swab  Result Value Ref Range   SARS Coronavirus 2 NEGATIVE NEGATIVE    Comment: (NOTE) If result is NEGATIVE SARS-CoV-2 target nucleic acids are NOT DETECTED. The SARS-CoV-2 RNA is generally detectable in upper and lower  respiratory specimens during the acute phase of infection. The lowest  concentration of SARS-CoV-2 viral copies this assay can detect is 250  copies / mL. A negative result does not preclude SARS-CoV-2 infection  and should not be used as the sole basis for treatment or other  patient management decisions.  A negative result may occur with  improper specimen collection / handling, submission of specimen other  than nasopharyngeal swab, presence of viral mutation(s) within the  areas targeted by this assay, and inadequate number of viral copies  (<250 copies / mL). A negative result must be combined with clinical  observations, patient history, and epidemiological information. If result is POSITIVE SARS-CoV-2 target nucleic acids are DETECTED. The SARS-CoV-2 RNA is generally detectable in upper and lower  respiratory specimens dur ing the acute phase of infection.  Positive  results are indicative of active  infection with SARS-CoV-2.  Clinical  correlation with patient history and other diagnostic information is  necessary to determine patient infection status.  Positive results do  not rule out bacterial infection or co-infection with other viruses. If result is PRESUMPTIVE POSTIVE SARS-CoV-2 nucleic acids MAY BE PRESENT.   A presumptive positive result was obtained on the submitted specimen  and confirmed on repeat testing.  While 2019 novel coronavirus  (SARS-CoV-2) nucleic acids may be present in the submitted sample  additional confirmatory testing may be  necessary for epidemiological  and / or clinical management purposes  to differentiate between  SARS-CoV-2 and other Sarbecovirus currently known to infect humans.  If clinically indicated additional testing with an alternate test  methodology [phone]) is advised. The SARS-CoV-2 RNA is generally  detectable in upper and lower respiratory sp ecimens during the acute  phase of infection. The expected result is Negative. Fact Sheet for Patients:  StrictlyIdeas.no Fact Sheet for Healthcare Providers: BankingDealers.co.za This test is not yet approved or cleared by the [place] FDA and has been authorized for detection and/or diagnosis of SARS-CoV-2 by FDA under an Emergency Use Authorization (EUA).  This EUA will remain in effect (meaning this test can be used) for the duration of the COVID-19 declaration under Section 564(b)(1) of the Act, 21 U.S.C. section 360bbb-3(b)(1), unless the authorization is terminated or revoked sooner. Performed at [facility], [place] [address], [place], [place] [address]   Type and screen     Status: None (Preliminary result)   Collection Time: 01/25/19  8:13 AM  Result Value Ref Range   ABO/RH(D) PENDING    Antibody Screen PENDING    Sample Expiration      01/28/2019,2359 Performed at [facility], [place] [address] [place], [place]  [address]    No results found.  Pending Labs Unresulted Labs (From admission, onward)    Start     Ordered   01/25/19 0706  CBC with Differential/Platelet  Once,   STAT     01/25/19 0705          Vitals/Pain Today's Vitals   01/25/19 0526 01/25/19 0531 01/25/19 0700 01/25/19 0800  BP:  105/70 124/71 113/75  Pulse:  72 (!) 111 (!) 107  Resp:  16    Temp:  98.2 F (36.8 C)    TempSrc:  Oral    SpO2:  97% 96% 98%  Weight:      Height:      PainSc: 10-Worst pain ever       Isolation Precautions No active isolations  Medications Medications  ondansetron (ZOFRAN) injection 4 mg (has no administration in time range)  HYDROmorphone (DILAUDID) injection 0.5 mg (0.5 mg Intravenous Given 01/25/19 0649)  HYDROmorphone (DILAUDID) injection 1 mg (1 mg Intravenous Given 01/24/19 2249)  0.9 %  sodium chloride infusion ( Intravenous Stopped 01/25/19 0855)    Mobility walks Low fall risk   Focused Assessments Cardiac Assessment Handoff:    No results found for: CKTOTAL, CKMB, CKMBINDEX, TROPONINI No results found for: DDIMER Does the Patient currently have chest pain? No      R Recommendations: See Admitting Provider Note  Report given to:   Additional Notes:

## 2019-01-25 NOTE — Anesthesia Preprocedure Evaluation (Signed)
Anesthesia Evaluation  Patient identified by MRN, date of birth, ID band Patient awake    Reviewed: Allergy & Precautions, NPO status , Patient's Chart, lab work & pertinent test results  Airway Mallampati: I  TM Distance: >3 FB Neck ROM: Full    Dental   Pulmonary Patient abstained from smoking.,    Pulmonary exam normal        Cardiovascular Normal cardiovascular exam     Neuro/Psych    GI/Hepatic   Endo/Other    Renal/GU      Musculoskeletal   Abdominal   Peds  Hematology   Anesthesia Other Findings   Reproductive/Obstetrics                             Anesthesia Physical Anesthesia Plan  ASA: II  Anesthesia Plan: General   Post-op Pain Management:    Induction: Intravenous  PONV Risk Score and Plan: 3 and Midazolam, Ondansetron, Dexamethasone and Treatment may vary due to age or medical condition  Airway Management Planned: LMA  Additional Equipment:   Intra-op Plan:   Post-operative Plan: Extubation in OR  Informed Consent: I have reviewed the patients History and Physical, chart, labs and discussed the procedure including the risks, benefits and alternatives for the proposed anesthesia with the patient or authorized representative who has indicated his/her understanding and acceptance.       Plan Discussed with: CRNA and Surgeon  Anesthesia Plan Comments:         Anesthesia Quick Evaluation

## 2019-01-25 NOTE — Transfer of Care (Signed)
Immediate Anesthesia Transfer of Care Note  Patient: Becky Larson  Procedure(s) Performed: VAGINAL MYOMECTOMY (N/A )  Patient Location: PACU  Anesthesia Type:General  Level of Consciousness: awake, alert  and oriented  Airway & Oxygen Therapy: Patient Spontanous Breathing and Patient connected to face mask oxygen  Post-op Assessment: Report given to RN and Post -op Vital signs reviewed and stable  Post vital signs: Reviewed and stable  Last Vitals:  Vitals Value Taken Time  BP 123/59 01/25/19 1538  Temp    Pulse 93 01/25/19 1540  Resp 19 01/25/19 1540  SpO2 100 % 01/25/19 1540  Vitals shown include unvalidated device data.  Last Pain:  Vitals:   01/25/19 1324  TempSrc: Oral  PainSc:       Patients Stated Pain Goal: 4 ([id number] [phone])  Complications: No apparent anesthesia complications

## 2019-01-26 ENCOUNTER — Encounter (HOSPITAL_COMMUNITY): Payer: Self-pay | Admitting: Gynecologic Oncology

## 2019-01-26 LAB — CBC
HCT: 35.8 % — ABNORMAL LOW (ref 36.0–46.0)
Hemoglobin: 10.7 g/dL — ABNORMAL LOW (ref 12.0–15.0)
MCH: 27.4 pg (ref 26.0–34.0)
MCHC: 29.9 g/dL — ABNORMAL LOW (ref 30.0–36.0)
MCV: 91.6 fL (ref 80.0–100.0)
Platelets: 325 10*3/uL (ref 150–400)
RBC: 3.91 MIL/uL (ref 3.87–5.11)
RDW: 19.7 % — ABNORMAL HIGH (ref 11.5–15.5)
WBC: 10.9 10*3/uL — ABNORMAL HIGH (ref 4.0–10.5)
nRBC: 0 % (ref 0.0–0.2)

## 2019-01-26 MED ORDER — DOXYCYCLINE HYCLATE 100 MG PO TABS: 100.0000 mg | ORAL_TABLET | Freq: Two times a day (BID) | ORAL | 0 refills | Status: DC

## 2019-01-26 NOTE — Progress Notes (Signed)
Spoke with the patient about findings at surgery and procedure performed. She is very happy to hear that uterus was left insitu. She voices understanding of the future risk of sloughing of necrotic tissue, infection, and need for hysterectomy. Plan to discharge with close follow-up next week, precautions, and course of doxycycline.  Jeral Pinch MD Gynecologic Oncology

## 2019-01-26 NOTE — Discharge Summary (Signed)
Physician Discharge Summary  Patient ID: Becky Larson MRN: [id number] DOB/AGE: 1980-04-29 38 y.o.  Admit date: 01/24/2019 Discharge date: 01/26/2019  Admission Diagnoses: Uterine fibroid  Discharge Diagnoses:  Principal Problem:   Uterine fibroid Active Problems:   Fibroid uterus   Discharged Condition:  The patient is in good condition and stable for discharge.    Hospital Course: On 01/25/2019, the patient underwent the following: Procedure(s): VAGINAL MYOMECTOMY.  The postoperative course was uneventful.  She was discharged to home on postoperative day 1 tolerating a regular diet, voiding, no pain reported, ambulating, passing flatus.  Consults: None  Significant Diagnostic Studies: None  Treatments: surgery: see above  Discharge Exam: Blood pressure 115/64, pulse 91, temperature 99.5 F (37.5 C), temperature source Oral, resp. rate 14, height 5\' 6"  (1.676 m), weight 165 lb (74.8 kg), last menstrual period 01/13/2019, SpO2 100 %. General appearance: alert, cooperative and no distress Resp: clear to auscultation bilaterally Cardio: regular rate and rhythm, S1, S2 normal, no murmur, click, rub or gallop GI: soft, non-tender; bowel sounds normal; no masses,  no organomegaly Extremities: extremities normal, atraumatic, no cyanosis or edema  Minimal dark blood discharge reported  Disposition: Discharge disposition: 01-Home or Self Care       Discharge Instructions    Call MD for:  difficulty breathing, headache or visual disturbances   Complete by: As directed    Call MD for:  extreme fatigue   Complete by: As directed    Call MD for:  hives   Complete by: As directed    Call MD for:  persistant dizziness or light-headedness   Complete by: As directed    Call MD for:  persistant nausea and vomiting   Complete by: As directed    Call MD for:  redness, tenderness, or signs of infection (pain, swelling, redness, odor or green/yellow discharge around incision site)    Complete by: As directed    Call MD for:  severe uncontrolled pain   Complete by: As directed    Call MD for:  temperature >100.4   Complete by: As directed    Diet - low sodium heart healthy   Complete by: As directed    Driving Restrictions   Complete by: As directed    Do not take narcotics and drive.   Increase activity slowly   Complete by: As directed    Lifting restrictions   Complete by: As directed    No lifting greater than 10 lbs.   Sexual Activity Restrictions   Complete by: As directed    No sexual activity, nothing in the vagina, for 4 weeks.     Allergies as of 01/26/2019      Reactions   Morphine And Related Itching   respritatory issues as well      Medication List    TAKE these medications   docusate sodium 100 MG capsule Commonly known as: COLACE Take 1 capsule (100 mg total) by mouth 2 (two) times daily. For constipation   doxycycline 100 MG tablet Commonly known as: VIBRA-TABS Take 1 tablet (100 mg total) by mouth 2 (two) times daily.   ibuprofen 600 MG tablet Commonly known as: ADVIL Take 1 tablet (600 mg total) by mouth every 6 (six) hours as needed.   iron polysaccharides 150 MG capsule Commonly known as: NIFEREX Take 1 capsule (150 mg total) by mouth 2 (two) times daily.   oxyCODONE 5 MG immediate release tablet Commonly known as: Oxy IR/ROXICODONE Take 1 tablet (5 mg  total) by mouth every 6 (six) hours as needed for severe pain.      Follow-up Information    Lafonda Mosses, MD Follow up on 02/02/2019.   Specialty: Gynecologic Oncology Why: at 10:15 am at the [facility] information: [address] [place] [address] [phone]           Greater than thirty minutes were spend for face to face discharge instructions and discharge orders/summary in EPIC.   Signed: Dorothyann Gibbs 01/26/2019, 10:16 AM

## 2019-01-26 NOTE — Discharge Instructions (Signed)
01/25/2019  Return to work: 2-3 days if applicable  Activity: 1. Be up and out of the bed during the day.  Take a nap if needed.  You may walk up steps but be careful and use the hand rail.  Stair climbing will tire you more than you think, you may need to stop part way and rest.   2. No lifting or straining for 6 weeks.  3. No driving for until off narcotics and you feel like you can brake safely.  Do not drive if you are taking narcotic pain medicine.  4. Shower daily.  No tub baths until cleared by your surgeon.   5. No sexual activity and nothing in the vagina for 4 weeks.  6. You may experience vaginal spotting after surgery.  The spotting is normal but if you experience heavy bleeding, call our office.  7. Take Tylenol or ibuprofen first for pain and only use narcotic pain medication for severe pain not relieved by the Tylenol or Ibuprofen.  Monitor your Tylenol intake to a max of 4,000 mg.  Diet: 1. Low sodium Heart Healthy Diet is recommended.  2. It is safe to use a laxative, such as Miralax or Colace, if you have difficulty moving your bowels. You can take Sennakot at bedtime every evening to keep bowel movements regular and to prevent constipation.    Reasons to call the Doctor:  Fever - Oral temperature greater than 100.4 degrees Fahrenheit  Foul-smelling vaginal discharge  Difficulty urinating  Nausea and vomiting  Increased pain at the site of the incision that is unrelieved with pain medicine.  Difficulty breathing with or without chest pain  New calf pain especially if only on one side  Sudden, continuing increased vaginal bleeding with or without clots.   Contacts: For questions or concerns you should contact:  Dr. Jeral Pinch at [phone]  Joylene John, NP at [phone]  After Hours: call [phone] and have the GYN Oncologist paged/contacted  Doxycycline tablets or capsules What is this medicine? DOXYCYCLINE (dox i SYE kleen) is a  tetracycline antibiotic. It kills certain bacteria or stops their growth. It is used to treat many kinds of infections, like dental, skin, respiratory, and urinary tract infections. It also treats acne, Lyme disease, malaria, and certain sexually transmitted infections. This medicine may be used for other purposes; ask your health care provider or pharmacist if you have questions. COMMON BRAND NAME(S): Acticlate, Adoxa, Adoxa CK, Adoxa Pak, Adoxa TT, Alodox, Avidoxy, Doxal, LYMEPAK, Mondoxyne NL, Monodox, Morgidox 1x, Morgidox 1x Kit, Morgidox 2x, Morgidox 2x Kit, NutriDox, Ocudox, TARGADOX, Vibra-Tabs, Vibramycin What should I tell my health care provider before I take this medicine? They need to know if you have any of these conditions:  liver disease  long exposure to sunlight like working outdoors  stomach problems like colitis  an unusual or allergic reaction to doxycycline, tetracycline antibiotics, other medicines, foods, dyes, or preservatives  pregnant or trying to get pregnant  breast-feeding How should I use this medicine? Take this medicine by mouth with a full glass of water. Follow the directions on the prescription label. It is best to take this medicine without food, but if it upsets your stomach take it with food. Take your medicine at regular intervals. Do not take your medicine more often than directed. Take all of your medicine as directed even if you think you are better. Do not skip doses or stop your medicine early. Talk to your pediatrician regarding the use of this  medicine in children. While this drug may be prescribed for selected conditions, precautions do apply. Overdosage: If you think you have taken too much of this medicine contact a poison control center or emergency room at once. NOTE: This medicine is only for you. Do not share this medicine with others. What if I miss a dose? If you miss a dose, take it as soon as you can. If it is almost time for your next  dose, take only that dose. Do not take double or extra doses. What may interact with this medicine?  antacids  barbiturates  birth control pills  bismuth subsalicylate  carbamazepine  methoxyflurane  other antibiotics  phenytoin  vitamins that contain iron  warfarin This list may not describe all possible interactions. Give your health care provider a list of all the medicines, herbs, non-prescription drugs, or dietary supplements you use. Also tell them if you smoke, drink alcohol, or use illegal drugs. Some items may interact with your medicine. What should I watch for while using this medicine? Tell your doctor or health care professional if your symptoms do not improve. Do not treat diarrhea with over the counter products. Contact your doctor if you have diarrhea that lasts more than 2 days or if it is severe and watery. Do not take this medicine just before going to bed. It may not dissolve properly when you lay down and can cause pain in your throat. Drink plenty of fluids while taking this medicine to also help reduce irritation in your throat. This medicine can make you more sensitive to the sun. Keep out of the sun. If you cannot avoid being in the sun, wear protective clothing and use sunscreen. Do not use sun lamps or tanning beds/booths. Birth control pills may not work properly while you are taking this medicine. Talk to your doctor about using an extra method of birth control. If you are being treated for a sexually transmitted infection, avoid sexual contact until you have finished your treatment. Your sexual partner may also need treatment. Avoid antacids, aluminum, calcium, magnesium, and iron products for 4 hours before and 2 hours after taking a dose of this medicine. If you are using this medicine to prevent malaria, you should still protect yourself from contact with mosquitos. Stay in screened-in areas, use mosquito nets, keep your body covered, and use an insect  repellent. What side effects may I notice from receiving this medicine? Side effects that you should report to your doctor or health care professional as soon as possible:  allergic reactions like skin rash, itching or hives, swelling of the face, lips, or tongue  difficulty breathing  fever  itching in the rectal or genital area  pain on swallowing  rash, fever, and swollen lymph nodes  redness, blistering, peeling or loosening of the skin, including inside the mouth  severe stomach pain or cramps  unusual bleeding or bruising  unusually weak or tired  yellowing of the eyes or skin Side effects that usually do not require medical attention (report to your doctor or health care professional if they continue or are bothersome):  diarrhea  loss of appetite  nausea, vomiting This list may not describe all possible side effects. Call your doctor for medical advice about side effects. You may report side effects to FDA at 1-800-FDA-1088. Where should I keep my medicine? Keep out of the reach of children. Store at room temperature, below 30 degrees C (86 degrees F). Protect from light. Keep container tightly closed.  Throw away any unused medicine after the expiration date. Taking this medicine after the expiration date can make you seriously ill. NOTE: This sheet is a summary. It may not cover all possible information. If you have questions about this medicine, talk to your doctor, pharmacist, or health care provider.  2020 Elsevier/Gold Standard (2018-06-09 13:44:53)

## 2019-01-26 NOTE — Progress Notes (Signed)
Discharge paperwork discussed with pt and family at the bedside.  They demonstrated understanding.  Pt was escorted by wheelchair in stable condition to main lobby.

## 2019-01-27 ENCOUNTER — Encounter: Payer: Self-pay | Admitting: Gynecologic Oncology

## 2019-01-27 LAB — SURGICAL PATHOLOGY

## 2019-01-27 NOTE — Progress Notes (Signed)
Called the patient to review final pathology from surgery Wednesday. She was very happy to hear benign results. She is overall doing well having minimal bleeding and pain. She denies any odor or discharge. She understands the importance of continuing taking the antibiotics and is aware of her appointment with me on Thursday. KTucker MF

## 2019-01-28 LAB — BPAM RBC
Blood Product Expiration Date: 202012062359
Blood Product Expiration Date: 202012062359
Blood Product Expiration Date: 202012062359
Blood Product Expiration Date: 202012062359
Unit Type and Rh: 5100
Unit Type and Rh: 5100
Unit Type and Rh: 5100
Unit Type and Rh: 5100

## 2019-01-28 LAB — TYPE AND SCREEN
ABO/RH(D): O POS
Antibody Screen: NEGATIVE
Unit division: 0
Unit division: 0
Unit division: 0
Unit division: 0

## 2019-01-29 ENCOUNTER — Other Ambulatory Visit: Payer: Self-pay | Admitting: Oncology

## 2019-01-30 NOTE — Progress Notes (Signed)
Gynecologic Oncology Return Clinic Visit  02/02/19   Reason for Visit: post-op follow-up, prolapsing uterine fibroid  Treatment History: - 2 years of heavy menstrual bleeding starting in 2018 - 10/2017 - negative, HPV 16/18/45 negative, HPV HR positive - EMB 2019 - negative for malignancy - hysteroscopic myomectomy in October 2019 with pathology revealing fragments of benign smooth muscle consistent with leiomyoma, no malignancy  - 1 Depo-Provera injection 03/2018 - had increased bleeding and passage of clots - visit on 11/11/18 for pap test, large mass prolapsing through the cervix into the vagaina, biopsy showed ulcerated granulation type tissue, no malignancy - MRI 11/2018: Uterus measures 15.7 x 8.3 x 8.6 cm.  Dominant mass arises posteriorly and extends into the endometrial cavity with herniation into the cervix and vagina, measuring 9.5 x 9.1 x 8.6 cm.  Additional intramural uterine fibroids noted, the largest measuring 2.2 cm.  Left ovary enlarged measuring 6.2 x 4.8 x 4.1 cm, 2 cysts identified the largest measuring 4 cm.  No mural nodularity or enhancing septation seen. -Presented to the ED on 10/1 with symptomatic anemia secondary to vaginal bleeding, hemoglobin was 4.4, received 2 units of packed red blood cells as well as 1 dose of IV Premarin.  Declined admission. -Seen by GYN oncology on 10/12, received 1 month dose of Depo-Lupron as well as 2 unit blood transfusion for persistent symptomatic anemia on 01/04/2019. -Presented to the emergency department with severe symptomatic anemia, hemoglobin 3.2, due to total of 4 units of packed red blood cells, underwent uterine artery embolization.  Had significant improvement in her bleeding.  -Presented to the emergency department on 11/3 with prolapsing necrotic fibroid.  On 11/4, underwent vaginal myomectomy.  Pathology revealed no hyperplasia or malignancy.   Interval History: Patient presents today approximately 1 week status post vaginal  myomectomy after presenting to the hospital with a necrotic prolapsing fibroid secondary to uterine artery embolization.  She overall endorses feeling much better for the first time in months.  She has a significant only improved level of energy and is able to walk up and down stairs without dizziness or shortness of breath.  She reports very light vaginal bleeding since surgery, using at most only several pads a day.  Yesterday, she had a light pad on for 6 hours and when she changed it, there was just a streak of blood on the metal.  She continues to take the antibiotics prescribed on discharge and has 2 days left.  She has noted a mild odor to her vaginal discharge since surgery but has not noticed any change in this odor or in the quantity of discharge.  She endorses having a good appetite without nausea or vomiting.  She denies any fevers or chills.  She continues to have some lower abdominal cramping pain for which she is using ibuprofen only, 800 mg every 4 hours.  This pain is very well controlled if she uses medications.  She felt that she was doing very well after leaving the hospital alternating between Tylenol and ibuprofen.  Without pain medicine, she feels that the cramping is 9-10/10.  She also endorses some hot flashes since receiving the Lupron which are unchanged.  Past Medical/Surgical History: Past Medical History:  Diagnosis Date  . Anemia    due to blood loss  . Dyspnea    due to exertion   . Ectopic pregnancy    Right  . Fibroid uterus     Past Surgical History:  Procedure Laterality Date  . CESAREAN  SECTION    . HYSTEROSCOPY W/D&C N/A 01/05/2018   Procedure: DILATATION AND CURETTAGE /HYSTEROSCOPY/MYOMECTOMY/POLYPECTOMY;  Surgeon: Aletha Halim, MD;  Location: [facility];  Service: Gynecology;  Laterality: N/A;  . IR ANGIOGRAM PELVIS SELECTIVE OR SUPRASELECTIVE  01/13/2019  . IR ANGIOGRAM PELVIS SELECTIVE OR SUPRASELECTIVE  01/13/2019  . IR ANGIOGRAM  SELECTIVE EACH ADDITIONAL VESSEL  01/13/2019  . IR ANGIOGRAM SELECTIVE EACH ADDITIONAL VESSEL  01/13/2019  . IR EMBO TUMOR ORGAN ISCHEMIA INFARCT INC GUIDE ROADMAPPING  01/13/2019  . IR FLUORO GUIDED NEEDLE PLC ASPIRATION/INJECTION LOC  01/13/2019  . IR US GUIDE VASC ACCESS RIGHT  01/13/2019  . LAPAROSCOPIC SALPINGOOPHERECTOMY Right    ectopic  . MYOMECTOMY N/A 01/25/2019   Procedure: VAGINAL MYOMECTOMY;  Surgeon: Lafonda Mosses, MD;  Location: [facility];  Service: Gynecology;  Laterality: N/A;    Family History  Problem Relation Age of Onset  . Hypertension Father   . Hypertension Paternal Grandfather   . Heart attack Paternal Grandfather   . Lung cancer Paternal Grandfather   . Colon cancer Maternal Grandmother   . Lung cancer Paternal Grandmother   . Lung cancer Maternal Grandfather   . Ovarian cancer Neg Hx   . Uterine cancer Neg Hx   . Cervical cancer Neg Hx   . Breast cancer Neg Hx     Social History   Socioeconomic History  . Marital status: Married    Spouse name: Not on file  . Number of children: Not on file  . Years of education: Not on file  . Highest education level: Not on file  Occupational History  . Not on file  Social Needs  . Financial resource strain: Not on file  . Food insecurity    Worry: Not on file    Inability: Not on file  . Transportation needs    Medical: Not on file    Non-medical: Not on file  Tobacco Use  . Smoking status: Never Smoker  . Smokeless tobacco: Never Used  Substance and Sexual Activity  . Alcohol use: Yes    Comment: socially  . Drug use: Yes    Frequency: 7.0 times per week    Types: Marijuana    Comment: uses daily  . Sexual activity: Yes    Birth control/protection: None  Lifestyle  . Physical activity    Days per week: Not on file    Minutes per session: Not on file  . Stress: Not on file  Relationships  . Social Herbalist on phone: Not on file    Gets together: Not on file    Attends  religious service: Not on file    Active member of club or organization: Not on file    Attends meetings of clubs or organizations: Not on file    Relationship status: Not on file  Other Topics Concern  . Not on file  Social History Narrative   Lives with husband and son. Currently in school for nursing.    Current Medications:  Current Outpatient Medications:  .  ibuprofen (ADVIL) 800 MG tablet, Take 800 mg by mouth every 6 (six) hours as needed., Disp: , Rfl:  .  iron polysaccharides (NIFEREX) 150 MG capsule, Take 1 capsule (150 mg total) by mouth 2 (two) times daily., Disp: 60 capsule, Rfl: 3  Review of Symptoms: As noted in HPI, patient endorses hot flashes and abdominal pain/cramping as well as vaginal bleeding. Denies appetite changes, fevers, chills, fatigue, unexplained weight changes.  Denies hearing loss, neck lumps or masses, mouth sores, ringing in ears, voice change, yellowing of the eyes, or vision problems. Denies cough, shortness of breath or wheezing. Denies chest pain, leg swelling or, or palpitations. Denies abdominal distention, constipation, diarrhea, nausea, vomiting, or early satiety. Denies dysuria, frequency, hematuria, incontinence. Denies pelvic pain. Denies pruritus, rash or wounds. Denies dizziness, problems with walking, headaches, numbness, or seizures. Denies swollen glands or lymph nodes, denies bruising or bleeding easily. Denies anxiety, depression, confusion, or decreased concentration.  Physical Exam: BP 107/68 (BP Location: Left Arm, Patient Position: Sitting)   Pulse 100   Temp 98.9 F (37.2 C) (Temporal)   Resp 20   Ht 5\' 6"  (1.676 m)   Wt 161 lb (73 kg)   LMP 01/13/2019   SpO2 100%   BMI 25.99 kg/m  General: Alert, oriented, no acute distress. HEENT: Sclera anicteric, conjunctival pallor much improved. Chest: Clear to auscultation bilaterally.  Port site clean. Cardiovascular: Regular rate and rhythm, no murmurs. Abdomen: Soft,  nontender.  Normoactive bowel sounds.  No masses or hepatosplenomegaly appreciated.   Extremities: Grossly normal range of motion.  Warm, well perfused.  No edema bilaterally. Skin: No rashes or lesions noted. Lymphatics: No cervical, supraclavicular, or inguinal adenopathy. GU: Normal appearing external genitalia without erythema, excoriation, or lesions.  Speculum exam reveals small amount of gray discharge, no significant odor noticed.  Cervix is 1-2 cm dilated, otherwise normal appearing without lesions or masses.  No component of previously prolapsing fibroid noted.  Pap test and HPV collected with minimal bleeding.  Bimanual exam reveals no significant cervical motion tenderness or tenderness with the exam.  Cervix minimally dilated.  Uterus overall decreased in size and now approximately 10-12 cm.  Laboratory & Radiologic Studies: Pathology from surgery on 11/4: A. UTERINE FIBROID, MYOMECTOMY:  - Benign leiomyoma (13.8 cm) with degenerative changes  - No evidence of malignancy  Assessment & Plan: Becky Larson is a 38 y.o. woman now 1 week status post vaginal myomectomy after presenting with a necrotic prolapsing uterine fibroid after uterine artery mobilization for severe symptomatic anemia in the setting of acute blood loss secondary to prolapsing fibroid with patient desiring uterine preservation.  Patient has overall done quite well postop.  We had a long conversation today about the continued risk of infection.  There are some case reports of significant infection involving the uterine wall after ablation in the setting of uterine fibroids.  I suspect that the patient's vaginal discharge is related to some necrosis still from the uterine artery embolization.  She has no systemic signs of infection but understands the importance of keeping very close track of these symptoms and calling our office if she develops fevers, chills, nausea, vomiting, or worsening and malodorous vaginal  discharge.  I have recommended that she continue to refrain from intercourse for at least another 2-3 weeks.  We discussed again the impact of uterine artery embolization on fertility.  The patient continues to state that she does not plan to pursue pregnancy.  I reviewed that although there are reports of patients getting pregnant after uterine artery embolization, this is contraindicated given the decreased blood supply to the uterus following the [place].  I will plan to have a phone visit with the patient in 2 weeks and will schedule further clinic follow-up as needed.  We discussed continuation of Lupron, and the patient at this point would like to stop the Lupron.  I think this is reasonable to see how she does  after the embolization and partial vaginal myomectomy.  Jeral Pinch, MD  Division of Gynecologic Oncology  Department of Obstetrics and Gynecology  [facility]

## 2019-01-31 ENCOUNTER — Inpatient Hospital Stay (HOSPITAL_COMMUNITY): Admission: RE | Admit: 2019-01-31 | Payer: Medicaid Other | Source: Home / Self Care | Admitting: Gynecologic Oncology

## 2019-01-31 ENCOUNTER — Encounter (HOSPITAL_COMMUNITY): Admission: RE | Payer: Self-pay | Source: Home / Self Care

## 2019-01-31 DIAGNOSIS — D250 Submucous leiomyoma of uterus: Secondary | ICD-10-CM

## 2019-01-31 SURGERY — MYOMECTOMY, ABDOMINAL APPROACH
Anesthesia: General

## 2019-02-02 ENCOUNTER — Other Ambulatory Visit: Payer: Self-pay

## 2019-02-02 ENCOUNTER — Encounter: Payer: Self-pay | Admitting: Gynecologic Oncology

## 2019-02-02 ENCOUNTER — Inpatient Hospital Stay (HOSPITAL_BASED_OUTPATIENT_CLINIC_OR_DEPARTMENT_OTHER): Payer: Medicaid Other | Admitting: Gynecologic Oncology

## 2019-02-02 ENCOUNTER — Other Ambulatory Visit (HOSPITAL_COMMUNITY)
Admission: RE | Admit: 2019-02-02 | Discharge: 2019-02-02 | Disposition: A | Discharge status: Home / Self Care | Payer: Medicaid Other | Source: Ambulatory Visit | Attending: Gynecologic Oncology | Admitting: Gynecologic Oncology

## 2019-02-02 VITALS — BP 107/68 | HR 100 | Temp 98.9°F | Resp 20 | Ht 66.0 in | Wt 161.0 lb

## 2019-02-02 DIAGNOSIS — D250 Submucous leiomyoma of uterus: Secondary | ICD-10-CM

## 2019-02-02 DIAGNOSIS — Z124 Encounter for screening for malignant neoplasm of cervix: Secondary | ICD-10-CM

## 2019-02-02 DIAGNOSIS — D251 Intramural leiomyoma of uterus: Secondary | ICD-10-CM

## 2019-02-02 NOTE — Patient Instructions (Addendum)
Plan to have a phone visit on February 14, 2019 with Dr. Berline Lopes for follow up.  You can take ibuprofen 800 mg every eight hours for discomfort as needed and can use a heating pad.  No intercourse for 4 weeks from your recent procedure (till Dec 2). Please call for any questions or concerns.

## 2019-02-02 NOTE — Addendum Note (Signed)
Addended by: Joylene John D on: 02/02/2019 01:26 PM   Modules accepted: Orders

## 2019-02-06 LAB — CYTOLOGY - PAP
Comment: NEGATIVE
Diagnosis: NEGATIVE
High risk HPV: NEGATIVE

## 2019-02-13 ENCOUNTER — Telehealth: Payer: Self-pay | Admitting: Gynecologic Oncology

## 2019-02-13 NOTE — Telephone Encounter (Signed)
Confirmed telephone encounter for 11/24 and verified info  °

## 2019-02-13 NOTE — Progress Notes (Signed)
Gynecologic Oncology Telehealth Consult Note: Gyn-Onc  I connected with Antonietta Jewel on 02/13/19 at 11:10 by telephone and verified that I am speaking with the correct person using two identifiers.  I discussed the limitations, risks, security and privacy concerns of performing an evaluation and management service by telemedicine and the availability of in-person appointments. I also discussed with the patient that there may be a patient responsible charge related to this service. The patient expressed understanding and agreed to proceed.  Patient's location: home Provider's location: hospital  Chief Complaint: Post-op follow-up in the setting of vaginal myomectomy after [place] for uterine-sparing  Treatment History: - 2 years of heavy menstrual bleeding starting in 2018 - 10/2017 - negative, HPV 16/18/45 negative, HPV HR positive - EMB 2019 - negative for malignancy - hysteroscopic myomectomy in October 2019 with pathology revealing fragments of benign smooth muscle consistent with leiomyoma, no malignancy  - 1 Depo-Provera injection 03/2018 - had increased bleeding and passage of clots - visit on 11/11/18 for pap test, large mass prolapsing through the cervix into the vagaina, biopsy showed ulcerated granulation type tissue, no malignancy - MRI 11/2018: Uterus measures 15.7 x 8.3 x 8.6 cm.  Dominant mass arises posteriorly and extends into the endometrial cavity with herniation into the cervix and vagina, measuring 9.5 x 9.1 x 8.6 cm.  Additional intramural uterine fibroids noted, the largest measuring 2.2 cm.  Left ovary enlarged measuring 6.2 x 4.8 x 4.1 cm, 2 cysts identified the largest measuring 4 cm.  No mural nodularity or enhancing septation seen. -Presented to the ED on 10/1 with symptomatic anemia secondary to vaginal bleeding, hemoglobin was 4.4, received 2 units of packed red blood cells as well as 1 dose of IV Premarin.  Declined admission. -Seen by GYN oncology on 10/12, received 1  month dose of Depo-Lupron as well as 2 unit blood transfusion for persistent symptomatic anemia on 01/04/2019. -Presented to the emergency department with severe symptomatic anemia, hemoglobin 3.2, due to total of 4 units of packed red blood cells, underwent uterine artery embolization.  Had significant improvement in her bleeding.  -Presented to the emergency department on 11/3 with prolapsing necrotic fibroid.  On 11/4, underwent vaginal myomectomy.  Pathology revealed no hyperplasia or malignancy.  Interval History: The patient reports doing very well since her last visit with me.  She denies any vaginal bleeding and reports that the discharge has almost completely ceased.  She describes that it is now mostly clear and denies any associated odor.  She reports resolution of her pelvic pain and has only occasional cramping that she describes as mild.  She denies any fevers or chills.  She reports a good appetite without nausea or emesis.  She reports normal bladder and bowel function.  Past Medical/Surgical History: Past Medical History:  Diagnosis Date  . Anemia    due to blood loss  . Dyspnea    due to exertion   . Ectopic pregnancy    Right  . Fibroid uterus     Past Surgical History:  Procedure Laterality Date  . CESAREAN SECTION    . HYSTEROSCOPY W/D&C N/A 01/05/2018   Procedure: DILATATION AND CURETTAGE /HYSTEROSCOPY/MYOMECTOMY/POLYPECTOMY;  Surgeon: Aletha Halim, MD;  Location: [place];  Service: Gynecology;  Laterality: N/A;  . IR ANGIOGRAM PELVIS SELECTIVE OR SUPRASELECTIVE  01/13/2019  . IR ANGIOGRAM PELVIS SELECTIVE OR SUPRASELECTIVE  01/13/2019  . IR ANGIOGRAM SELECTIVE EACH ADDITIONAL VESSEL  01/13/2019  . IR ANGIOGRAM SELECTIVE EACH ADDITIONAL VESSEL  01/13/2019  . IR EMBO TUMOR ORGAN ISCHEMIA INFARCT INC GUIDE ROADMAPPING  01/13/2019  . IR FLUORO GUIDED NEEDLE PLC ASPIRATION/INJECTION LOC  01/13/2019  . IR US GUIDE VASC ACCESS RIGHT  01/13/2019  .  LAPAROSCOPIC SALPINGOOPHERECTOMY Right    ectopic  . MYOMECTOMY N/A 01/25/2019   Procedure: VAGINAL MYOMECTOMY;  Surgeon: Lafonda Mosses, MD;  Location: [facility];  Service: Gynecology;  Laterality: N/A;    Family History  Problem Relation Age of Onset  . Hypertension Father   . Hypertension Paternal Grandfather   . Heart attack Paternal Grandfather   . Lung cancer Paternal Grandfather   . Colon cancer Maternal Grandmother   . Lung cancer Paternal Grandmother   . Lung cancer Maternal Grandfather   . Ovarian cancer Neg Hx   . Uterine cancer Neg Hx   . Cervical cancer Neg Hx   . Breast cancer Neg Hx     Social History   Socioeconomic History  . Marital status: Married    Spouse name: Not on file  . Number of children: Not on file  . Years of education: Not on file  . Highest education level: Not on file  Occupational History  . Not on file  Social Needs  . Financial resource strain: Not on file  . Food insecurity    Worry: Not on file    Inability: Not on file  . Transportation needs    Medical: Not on file    Non-medical: Not on file  Tobacco Use  . Smoking status: Never Smoker  . Smokeless tobacco: Never Used  Substance and Sexual Activity  . Alcohol use: Yes    Comment: socially  . Drug use: Yes    Frequency: 7.0 times per week    Types: Marijuana    Comment: uses daily  . Sexual activity: Yes    Birth control/protection: None  Lifestyle  . Physical activity    Days per week: Not on file    Minutes per session: Not on file  . Stress: Not on file  Relationships  . Social Herbalist on phone: Not on file    Gets together: Not on file    Attends religious service: Not on file    Active member of club or organization: Not on file    Attends meetings of clubs or organizations: Not on file    Relationship status: Not on file  Other Topics Concern  . Not on file  Social History Narrative   Lives with husband and son. Currently in school for  nursing.    Current Medications:  Current Outpatient Medications:  .  ibuprofen (ADVIL) 800 MG tablet, Take 800 mg by mouth every 6 (six) hours as needed., Disp: , Rfl:  .  iron polysaccharides (NIFEREX) 150 MG capsule, Take 1 capsule (150 mg total) by mouth 2 (two) times daily., Disp: 60 capsule, Rfl: 3  Review of Symptoms: Complete review is negative except as above in Interval History.  Physical Exam: There were no vitals taken for this visit. No PE given limitations of phone visit.  Laboratory & Radiologic Studies: Pap and HPV testing from 11/12 - negative  Assessment & Plan: Becky Larson Troneis a 38 y.o.woman status post vaginal myomectomy after presenting with a necrotic prolapsing uterine fibroid after uterine artery mobilization for severe symptomatic anemia in the setting of acute blood loss.  Becky Larson is overall doing very well and has no signs or symptoms concerning for uterine infection.  She  continues to heal after the embolization and surgery.  I recommended another week or 2 of abstinence from intercourse.  As we discussed at her last visit, the patient would like to postpone any additional Lupron injection at this time.  She voiced interest again during our discussion of possible hysteroscopic myomectomy for what is left of the inferior uterine fibroid.  I would recommend waiting at least 2-3 months after the uterine artery embolization before moving forward with such a procedure.  I let Georgina Snell know that I will reach out to Dr. Ilda Basset to see about getting her scheduled back to see him to discuss the possibility of such a procedure in the future.  I discussed the assessment and treatment plan with the patient. The patient was provided with an opportunity to ask questions and all were answered. The patient agreed with the plan and demonstrated an understanding of the instructions.   The patient was advised to call back or see an in-person evaluation if the symptoms worsen or if  the condition fails to improve as anticipated.   I provided 15 minutes of non face-to-face telephone visit time during this encounter, and > 50% was spent counseling as documented under my assessment & plan.   Jeral Pinch, MD  Division of Gynecologic Oncology  Department of Obstetrics and Gynecology  [facility]

## 2019-02-14 ENCOUNTER — Inpatient Hospital Stay (HOSPITAL_BASED_OUTPATIENT_CLINIC_OR_DEPARTMENT_OTHER): Payer: Medicaid Other | Admitting: Gynecologic Oncology

## 2019-02-14 ENCOUNTER — Encounter: Payer: Self-pay | Admitting: Gynecologic Oncology

## 2019-02-14 DIAGNOSIS — D259 Leiomyoma of uterus, unspecified: Secondary | ICD-10-CM

## 2019-03-07 ENCOUNTER — Telehealth: Payer: Self-pay | Admitting: *Deleted

## 2019-03-07 NOTE — Telephone Encounter (Signed)
Per Dr Berline Lopes and Lenna Sciara APP scheduled an appt with Dr Ilda Basset. Called and gave patient the appt

## 2019-03-29 ENCOUNTER — Encounter: Payer: Self-pay | Admitting: Obstetrics and Gynecology

## 2019-03-29 ENCOUNTER — Ambulatory Visit (INDEPENDENT_AMBULATORY_CARE_PROVIDER_SITE_OTHER): Payer: Medicaid Other | Admitting: Obstetrics and Gynecology

## 2019-03-29 ENCOUNTER — Other Ambulatory Visit: Payer: Self-pay

## 2019-03-29 VITALS — BP 121/83 | HR 80 | Wt 163.9 lb

## 2019-03-29 DIAGNOSIS — D500 Iron deficiency anemia secondary to blood loss (chronic): Secondary | ICD-10-CM

## 2019-03-29 DIAGNOSIS — N644 Mastodynia: Secondary | ICD-10-CM

## 2019-03-29 DIAGNOSIS — N760 Acute vaginitis: Secondary | ICD-10-CM

## 2019-03-29 DIAGNOSIS — D259 Leiomyoma of uterus, unspecified: Secondary | ICD-10-CM

## 2019-03-29 DIAGNOSIS — B9689 Other specified bacterial agents as the cause of diseases classified elsewhere: Secondary | ICD-10-CM

## 2019-03-29 DIAGNOSIS — B373 Candidiasis of vulva and vagina: Secondary | ICD-10-CM

## 2019-03-29 DIAGNOSIS — B3731 Acute candidiasis of vulva and vagina: Secondary | ICD-10-CM

## 2019-03-29 LAB — CBC
Hematocrit: 40 % (ref 34.0–46.6)
Hemoglobin: 13.1 g/dL (ref 11.1–15.9)
MCH: 27.9 pg (ref 26.6–33.0)
MCHC: 32.8 g/dL (ref 31.5–35.7)
MCV: 85 fL (ref 79–97)
Platelets: 333 10*3/uL (ref 150–450)
RBC: 4.69 x10E6/uL (ref 3.77–5.28)
RDW: 14.9 % (ref 11.7–15.4)
WBC: 10.6 10*3/uL (ref 3.4–10.8)

## 2019-03-29 MED ORDER — FLUCONAZOLE 150 MG PO TABS: 150.0000 mg | ORAL_TABLET | ORAL | 0 refills | Status: DC

## 2019-03-29 MED ORDER — METRONIDAZOLE 500 MG PO TABS: 500.0000 mg | ORAL_TABLET | Freq: Two times a day (BID) | ORAL | 0 refills | Status: DC

## 2019-03-29 NOTE — Progress Notes (Signed)
Obstetrics and Gynecology Established Patient Evaluation  Appointment Date: 03/29/2019  OBGYN Clinic: [facility]  Primary Care Provider: Patient, No Pcp Per   Chief Complaint:  Chief Complaint  Patient presents with  . Follow-up    History of Present Illness: Becky Larson is a 39 y.o. African-American [id number] (LMP: 03/13/2019), seen for the above chief complaint.   All pathology has been benign. Last dose of depo lupron was on 01/04/2019 (one month dose). Last pap 02/02/2019: negative and hpv negative  *11/2017: seen by Dr. Ilda Basset via [facility] referral for heavy vaginal bleeding, blood transfusion. *12/2017: hysteroscopy, myosure myomectomy for submucosal fibroid with approximately 5% of it's base left at the end of surgery. *11-02/2018: normal post op exam and improving AUB *10/2018: seen for vag d/c and post coital bleeding. Large mass noted prolapsing into vagina.  *11/2018: MRI confirms large fibroid *01/13/2019: co managed with gyn onc as patient didn't desire hysterectomy. ED admission for VB and anemia *01/13/2019: underwent [place] by IR  *01/24/2019: re-admit via ED for prolapsing fibroid *01/25/2019: partial vaginal myomectomy by Gyn Onc via looped PDS, endostitch and harmonic scalpel.  *02/14/2019: last seen by Gyn Onc.   LMP 12/21 x 3 days and light and not painful. Having some vaginal discharge. No pain with sex, no abdominal pain; no vb, discharge, itching, issuing with voiding or BM.  Has had a week of b/l breast tenderness but no masses, lumps, nipple discharge.   Review of Systems:  as noted in the History of Present Illness.  Patient Active Problem List   Diagnosis Date Noted  . Uterine fibroid 01/25/2019  . Fibroid uterus 01/24/2019  . Lower abdominal pain 01/16/2019  . Acute blood loss anemia 01/14/2019  . Anemia 01/13/2019  . Hypokalemia 01/13/2019  . Sinus tachycardia 01/13/2019  . Abnormal uterine bleeding due to leiomyoma of  uterus   . Intramural and submucous leiomyoma of uterus 01/02/2019  . Iron deficiency anemia due to chronic blood loss 01/02/2019  . Endometrial mass 12/22/2018  . Symptomatic anemia 12/22/2018  . Anemia associated with acute blood loss 03/18/2018  . Menorrhagia 03/18/2018  . HPV (human papilloma virus) infection 12/06/2017     Past Medical History:  Past Medical History:  Diagnosis Date  . Anemia    due to blood loss  . Dyspnea    due to exertion   . Ectopic pregnancy    Right  . Fibroid uterus     Past Surgical History:  Past Surgical History:  Procedure Laterality Date  . CESAREAN SECTION    . HYSTEROSCOPY WITH D & C N/A 01/05/2018   Procedure: DILATATION AND CURETTAGE /HYSTEROSCOPY/MYOMECTOMY/POLYPECTOMY;  Surgeon: Aletha Halim, MD;  Location: [place];  Service: Gynecology;  Laterality: N/A;  . IR ANGIOGRAM PELVIS SELECTIVE OR SUPRASELECTIVE  01/13/2019  . IR ANGIOGRAM PELVIS SELECTIVE OR SUPRASELECTIVE  01/13/2019  . IR ANGIOGRAM SELECTIVE EACH ADDITIONAL VESSEL  01/13/2019  . IR ANGIOGRAM SELECTIVE EACH ADDITIONAL VESSEL  01/13/2019  . IR EMBO TUMOR ORGAN ISCHEMIA INFARCT INC GUIDE ROADMAPPING  01/13/2019  . IR FLUORO GUIDED NEEDLE PLC ASPIRATION/INJECTION LOC  01/13/2019  . IR US GUIDE VASC ACCESS RIGHT  01/13/2019  . LAPAROSCOPIC SALPINGOOPHERECTOMY Right    ectopic  . MYOMECTOMY N/A 01/25/2019   Procedure: VAGINAL MYOMECTOMY;  Surgeon: Lafonda Mosses, MD;  Location: [facility];  Service: Gynecology;  Laterality: N/A;    Past Obstetrical History:  OB History  Gravida Para Term Preterm AB Living  7 2 2  0 5 2  SAB TAB Ectopic Multiple Live Births  3 2 0 0 2    # Outcome Date GA Lbr Len/2nd Weight Sex Delivery Anes PTL Lv  7 Term           6 Term           5 SAB           4 SAB           3 SAB           2 TAB           1 TAB             Past Gynecological History: As per HPI. She is currently using no method for contraception.    Social History:  Social History   Socioeconomic History  . Marital status: Married    Spouse name: Not on file  . Number of children: Not on file  . Years of education: Not on file  . Highest education level: Not on file  Occupational History  . Not on file  Tobacco Use  . Smoking status: Never Smoker  . Smokeless tobacco: Never Used  Substance and Sexual Activity  . Alcohol use: Yes    Comment: socially  . Drug use: Yes    Frequency: 7.0 times per week    Types: Marijuana    Comment: uses daily  . Sexual activity: Yes    Birth control/protection: None  Other Topics Concern  . Not on file  Social History Narrative   Lives with husband and son. Currently in school for nursing.   Social Determinants of Health   Financial Resource Strain:   . Difficulty of Paying Living Expenses: Not on file  Food Insecurity:   . Worried About Charity fundraiser in the Last Year: Not on file  . Ran Out of Food in the Last Year: Not on file  Transportation Needs:   . Lack of Transportation (Medical): Not on file  . Lack of Transportation (Non-Medical): Not on file  Physical Activity:   . Days of Exercise per Week: Not on file  . Minutes of Exercise per Session: Not on file  Stress:   . Feeling of Stress : Not on file  Social Connections:   . Frequency of Communication with Friends and Family: Not on file  . Frequency of Social Gatherings with Friends and Family: Not on file  . Attends Religious Services: Not on file  . Active Member of Clubs or Organizations: Not on file  . Attends Archivist Meetings: Not on file  . Marital Status: Not on file  Intimate Partner Violence:   . Fear of Current or Ex-Partner: Not on file  . Emotionally Abused: Not on file  . Physically Abused: Not on file  . Sexually Abused: Not on file    Family History:  Family History  Problem Relation Age of Onset  . Hypertension Father   . Hypertension Paternal Grandfather   . Heart attack  Paternal Grandfather   . Lung cancer Paternal Grandfather   . Colon cancer Maternal Grandmother   . Lung cancer Paternal Grandmother   . Lung cancer Maternal Grandfather   . Ovarian cancer Neg Hx   . Uterine cancer Neg Hx   . Cervical cancer Neg Hx   . Breast cancer Neg Hx    She denies any female cancers, bleeding or blood clotting disorders.    Medications  Becky Larson. Oka had no medications administered during this visit. Current Outpatient Medications  Medication Sig Dispense Refill  . iron polysaccharides (NIFEREX) 150 MG capsule Take 1 capsule (150 mg total) by mouth 2 (two) times daily. 60 capsule 3  . ibuprofen (ADVIL) 800 MG tablet Take 800 mg by mouth every 6 (six) hours as needed.     No current facility-administered medications for this visit.    Allergies Morphine and related   Physical Exam:  BP 121/83   Pulse 80   Wt 163 lb 14.4 oz (74.3 kg)   BMI 26.45 kg/m  Body mass index is 26.45 kg/m. General appearance: Well nourished, well developed female in no acute distress.  Neck:  Supple, normal appearance, and no thyromegaly  Breast: no masses, normal palpation Cardiovascular: normal s1 and s2.  No murmurs, rubs or gallops. Respiratory:  Clear to auscultation bilateral. Normal respiratory effort Abdomen: positive bowel sounds and no masses, hernias; diffusely non tender to palpation, non distended Neuro/Psych:  Normal mood and affect.  Skin:  Warm and dry.  Lymphatic:  No inguinal lymphadenopathy.   Pelvic exam: is not limited by body habitus EGBUS: within normal limits Vagina: scant yellowish discharge in vault  Cervix: normal appearing cervix without tenderness, discharge or lesions.  Uterus:  enlarged, c/w 12-14 week size and non tender and Adnexa:  normal adnexa and no mass, fullness, tenderness Rectovaginal: deferred  Laboratory: none  Radiology: none  Assessment: pt doing well  Plan:  1. Mastalgia Likely due to cycle. Pt told to let us  know if it doesn't resolve in a month or two  2. Vulvovaginal candidiasis meds sent in  3. BV (bacterial vaginosis) See above  4. Iron deficiency anemia due to chronic blood loss Recheck cbc today  5. Uterine leiomyoma, unspecified location D/w her I recommend exp follow up at this point  RTC 6 months for exam  Durene Romans MD Attending Center for [facility])

## 2019-05-12 DIAGNOSIS — N760 Acute vaginitis: Secondary | ICD-10-CM | POA: Diagnosis not present

## 2019-05-12 DIAGNOSIS — Z113 Encounter for screening for infections with a predominantly sexual mode of transmission: Secondary | ICD-10-CM | POA: Diagnosis not present

## 2019-05-26 ENCOUNTER — Ambulatory Visit: Payer: Medicaid Other

## 2019-10-09 DIAGNOSIS — Z87892 Personal history of anaphylaxis: Secondary | ICD-10-CM | POA: Diagnosis not present

## 2019-11-26 ENCOUNTER — Other Ambulatory Visit: Payer: Self-pay

## 2019-11-26 ENCOUNTER — Emergency Department (HOSPITAL_COMMUNITY)
Admission: EM | Admit: 2019-11-26 | Discharge: 2019-11-27 | Disposition: A | Discharge status: Home / Self Care | Payer: Medicaid Other | Attending: Emergency Medicine | Admitting: Emergency Medicine

## 2019-11-26 ENCOUNTER — Encounter (HOSPITAL_COMMUNITY): Payer: Self-pay | Admitting: Emergency Medicine

## 2019-11-26 DIAGNOSIS — M545 Low back pain: Secondary | ICD-10-CM | POA: Diagnosis present

## 2019-11-26 DIAGNOSIS — Z5321 Procedure and treatment not carried out due to patient leaving prior to being seen by health care provider: Secondary | ICD-10-CM | POA: Diagnosis not present

## 2019-11-26 NOTE — ED Triage Notes (Signed)
C/o lower back pain since picking someone up yesterday.  Took 2 of her mom's muscle relaxers and 1 of her brother's muscle relaxers without relief.  Also taking extra strength Tylenol.

## 2019-11-26 NOTE — ED Notes (Signed)
Pt left, stating that she didn't want to wait 6 hrs in the waiting room.

## 2019-11-28 ENCOUNTER — Telehealth: Payer: Self-pay | Admitting: *Deleted

## 2019-11-28 NOTE — Telephone Encounter (Signed)
Becky Larson presented to the ED and left before being seen by the provider on 11/26/19. The patient has been enrolled in an automated general discharge outreach program and 2 attempts to contact the patient will be made to follow up on their ED visit and subsequent needs. The care management team is available to provide assistance to this patient at any time.   Lenor Coffin, RN, BSN, [place] [facility] [place] [phone]

## 2019-12-06 ENCOUNTER — Encounter: Payer: Self-pay | Admitting: Obstetrics and Gynecology

## 2019-12-06 ENCOUNTER — Other Ambulatory Visit: Payer: Self-pay

## 2019-12-06 ENCOUNTER — Ambulatory Visit (INDEPENDENT_AMBULATORY_CARE_PROVIDER_SITE_OTHER): Payer: Medicaid Other | Admitting: Obstetrics and Gynecology

## 2019-12-06 VITALS — BP 101/66 | HR 74 | Ht 66.0 in | Wt 183.0 lb

## 2019-12-06 DIAGNOSIS — Z86018 Personal history of other benign neoplasm: Secondary | ICD-10-CM

## 2019-12-06 NOTE — Progress Notes (Signed)
Obstetrics and Gynecology Visit Return Patient Evaluation  Appointment Date: 12/06/2019  Primary Care Provider: Patient, No Pcp Per  OBGYN Clinic: [facility]  Chief Complaint: follow up fibroids, h/o [place]  History of Present Illness:   *11/2017: seen by Dr. Ilda Basset via [facility] referral for heavy vaginal bleeding, blood transfusion. *12/2017: hysteroscopy, myosure myomectomy for submucosal fibroid with approximately 5% of it's base left at the end of surgery. *11-02/2018: normal post op exam and improving AUB *10/2018: seen for vag d/c and post coital bleeding. Large mass noted prolapsing into vagina.  *11/2018: MRI confirms large fibroid *01/13/2019: co managed with gyn onc as patient didn't desire hysterectomy. ED admission for VB and anemia *01/13/2019: underwent [place] by IR  *01/24/2019: re-admit via ED for prolapsing fibroid *01/25/2019: partial vaginal myomectomy by Gyn Onc via looped PDS, endostitch and harmonic scalpel.  *02/14/2019: last seen by Gyn Onc.   Interval History:  Becky Larson is a 39 y.o. African-American [id number] (LMP: 12/02/2019) seen for follow up today after feeling enlarged uterus on last exam in January. She has a significant past medical history of uterine fibroids s/p partial vaginal myomectomy 01/2019 and ectopic pregnancy.  Since last visit in January 2021, she reports having regular periods once a month that are about 3-4 days in length and have light bleeding with dark blood. Denies significant pain or cramping with mensuration, spotting or bleeding between cycles, pain or cramping between cycles, pain with sex, changes in bowel movements, changes in vaginal discharge. She notes that she has tracked her breast pain since last visit and says it only occurs just before her periods and no other time.  Her most recent period was 09/11-09-14 and was light with no significant discomfort. She expressed a desire to not get  pregnant in the future and plans to use condoms and discuss vasectomy with her husband.  She has no current concerns or complaints.   Review of Systems:  as noted in the History of Present Illness.   Patient Active Problem List   Diagnosis Date Noted  . Uterine fibroid 01/25/2019  . Fibroid uterus 01/24/2019  . Lower abdominal pain 01/16/2019  . Acute blood loss anemia 01/14/2019  . Anemia 01/13/2019  . Hypokalemia 01/13/2019  . Sinus tachycardia 01/13/2019  . Abnormal uterine bleeding due to leiomyoma of uterus   . Intramural and submucous leiomyoma of uterus 01/02/2019  . Iron deficiency anemia due to chronic blood loss 01/02/2019  . Endometrial mass 12/22/2018  . Symptomatic anemia 12/22/2018  . Anemia associated with acute blood loss 03/18/2018  . Menorrhagia 03/18/2018  . 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 Cleone Hulick. Weller had no medications administered during this visit. Current Outpatient Medications  Medication Sig Dispense Refill  . fluconazole (DIFLUCAN) 150 MG tablet Take 1 tablet (150 mg total) by mouth every 3 (three) days. 2 tablet 0  . ibuprofen (ADVIL) 800 MG tablet Take 800 mg by mouth every 6 (six) hours as needed.    . iron polysaccharides (NIFEREX) 150 MG capsule Take 1 capsule (150 mg total) by mouth 2 (two) times daily. 60 capsule 3  . metroNIDAZOLE (FLAGYL) 500 MG tablet Take 1 tablet (500 mg total) by mouth 2 (two) times daily. 14 tablet 0   No current facility-administered medications for this visit.    Allergies: is allergic to morphine and related.  Physical Exam:  BP 101/66   Pulse 74   Ht 5\' 6"  (  1.676 m)   Wt 183 lb (83 kg)   LMP 12/02/2019   BMI 29.54 kg/m  Body mass index is 29.54 kg/m. General appearance: Well nourished, well developed female in no acute distress.  Abdomen: soft, nttp nd Neuro/Psych:  Normal mood and affect.    Pelvic exam:  EGBUS, vaginal vault and cervix: within normal  limits Uterus feels smaller at around 10wk sized, nttp  Assessment: pt doing well  Plan:  1. History of uterine fibroid and [place] Routine care. I d/w her re: risk of pregnancy after [place] and she does not desire to try to conceive. Will use condoms for but will talk to partner about vasectomy after discussion today   RTC: PRN  Durene Romans MD Attending Center for [facility])

## 2019-12-24 DIAGNOSIS — M5441 Lumbago with sciatica, right side: Secondary | ICD-10-CM | POA: Diagnosis not present

## 2019-12-24 DIAGNOSIS — S39012A Strain of muscle, fascia and tendon of lower back, initial encounter: Secondary | ICD-10-CM | POA: Diagnosis not present

## 2020-02-03 DIAGNOSIS — Z113 Encounter for screening for infections with a predominantly sexual mode of transmission: Secondary | ICD-10-CM | POA: Diagnosis not present

## 2020-02-03 DIAGNOSIS — N760 Acute vaginitis: Secondary | ICD-10-CM | POA: Diagnosis not present

## 2020-02-03 DIAGNOSIS — N939 Abnormal uterine and vaginal bleeding, unspecified: Secondary | ICD-10-CM | POA: Diagnosis not present

## 2020-02-03 DIAGNOSIS — R8281 Pyuria: Secondary | ICD-10-CM | POA: Diagnosis not present

## 2020-02-27 ENCOUNTER — Ambulatory Visit: Payer: Medicaid Other

## 2020-02-28 ENCOUNTER — Other Ambulatory Visit (HOSPITAL_COMMUNITY)
Admission: RE | Admit: 2020-02-28 | Discharge: 2020-02-28 | Disposition: A | Discharge status: Home / Self Care | Payer: Medicaid Other | Source: Ambulatory Visit | Attending: Family Medicine | Admitting: Family Medicine

## 2020-02-28 ENCOUNTER — Ambulatory Visit (INDEPENDENT_AMBULATORY_CARE_PROVIDER_SITE_OTHER): Payer: Medicaid Other | Admitting: *Deleted

## 2020-02-28 ENCOUNTER — Other Ambulatory Visit: Payer: Self-pay

## 2020-02-28 ENCOUNTER — Encounter: Payer: Self-pay | Admitting: *Deleted

## 2020-02-28 VITALS — BP 124/67 | HR 87 | Ht 66.0 in | Wt 185.5 lb

## 2020-02-28 DIAGNOSIS — N898 Other specified noninflammatory disorders of vagina: Secondary | ICD-10-CM

## 2020-02-28 NOTE — Progress Notes (Signed)
Pt reports that she was seen @ local Urgent care and was treated with Metronidazole for presumed BV on 11/15. She was called and informed of +Trich on [place] [address] #5 however she had sex on [address] #4. She completed course of metronidazole and partner then got treatment. She has had white vaginal discharge, itching and odor x2 weeks. She is concerned about possible re-infection due to timing of treatment and sexual activity. Self swab obtained. Pt advised that she will be informed of test results as well as treatment indicated if any via Mychart. She voiced understanding.

## 2020-02-28 NOTE — Progress Notes (Signed)
Agree with A & P. 

## 2020-02-29 LAB — CERVICOVAGINAL ANCILLARY ONLY
Bacterial Vaginitis (gardnerella): NEGATIVE
Candida Glabrata: NEGATIVE
Candida Vaginitis: POSITIVE — AB
Chlamydia: NEGATIVE
Comment: NEGATIVE
Comment: NEGATIVE
Comment: NEGATIVE
Comment: NEGATIVE
Comment: NEGATIVE
Comment: NORMAL
Neisseria Gonorrhea: NEGATIVE
Trichomonas: NEGATIVE

## 2020-03-01 ENCOUNTER — Telehealth: Payer: Self-pay | Admitting: *Deleted

## 2020-03-01 ENCOUNTER — Other Ambulatory Visit: Payer: Self-pay | Admitting: *Deleted

## 2020-03-01 DIAGNOSIS — B373 Candidiasis of vulva and vagina: Secondary | ICD-10-CM

## 2020-03-01 DIAGNOSIS — B3731 Acute candidiasis of vulva and vagina: Secondary | ICD-10-CM

## 2020-03-01 MED ORDER — FLUCONAZOLE 150 MG PO TABS: ORAL_TABLET | ORAL | 0 refills | Status: DC

## 2020-03-01 NOTE — Telephone Encounter (Addendum)
-----   Message from Chancy Milroy, MD sent at 03/01/2020  8:37 AM EST ----- Please send in Rx for Difucan 150 mg po now and repeat in 3 days. Pt is aware. Thanks Legrand Como   12/10  1015  Per chart review, pt had not yet read Mychart message regarding test results. I called her and informed of results as well as treatment will be sent to her pharmacy. Pt voiced understanding.

## 2020-06-04 DIAGNOSIS — N898 Other specified noninflammatory disorders of vagina: Secondary | ICD-10-CM | POA: Diagnosis not present

## 2020-06-04 DIAGNOSIS — M79622 Pain in left upper arm: Secondary | ICD-10-CM | POA: Diagnosis not present

## 2020-06-04 DIAGNOSIS — L989 Disorder of the skin and subcutaneous tissue, unspecified: Secondary | ICD-10-CM | POA: Diagnosis not present

## 2020-06-04 DIAGNOSIS — L0292 Furuncle, unspecified: Secondary | ICD-10-CM | POA: Diagnosis not present

## 2020-06-04 DIAGNOSIS — E669 Obesity, unspecified: Secondary | ICD-10-CM | POA: Diagnosis not present

## 2020-06-04 DIAGNOSIS — Z13220 Encounter for screening for lipoid disorders: Secondary | ICD-10-CM | POA: Diagnosis not present

## 2020-06-04 DIAGNOSIS — Z87892 Personal history of anaphylaxis: Secondary | ICD-10-CM | POA: Diagnosis not present

## 2020-06-04 DIAGNOSIS — Z0000 Encounter for general adult medical examination without abnormal findings: Secondary | ICD-10-CM | POA: Diagnosis not present

## 2020-06-04 DIAGNOSIS — N760 Acute vaginitis: Secondary | ICD-10-CM | POA: Diagnosis not present

## 2020-07-08 IMAGING — XA IR ANGIO/PELVIC SELECTIVE EA VESSEL
12 of 13 series · 12 of 24 positions shown · IV contrast (IODINE)
Comparison: none

INDICATION: 38-year-old female with uterine fibroids complicated by severe
prolonged bleeding. She is currently admitted with symptomatic
anemia. Her hemoglobin is less than 4 and she requires a multi unit
transfusion. She has been highly resistant to hysterectomy. She
presents now for superior hypogastric nerve block and bilateral
uterine artery embolization in an effort to prevent further
life-threatening blood-loss.

[Series 4: care body 4 · 1 of 21 frames shown (1 of 10)]
[frame 17/21]
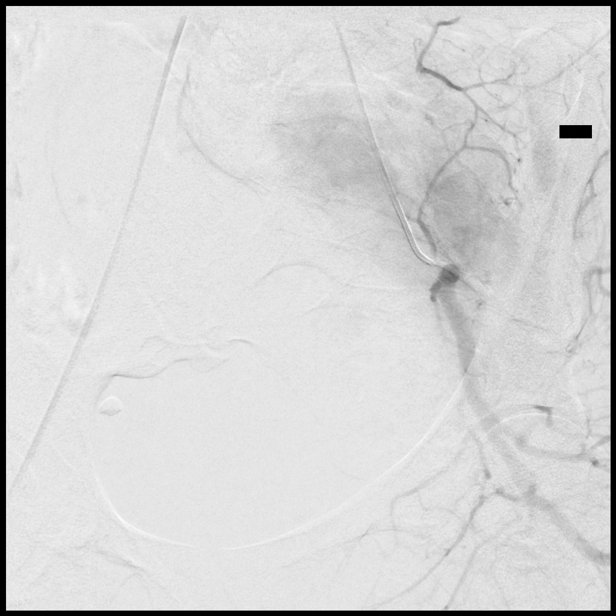

[Series 5: care body 4 · 1 of 24 frames shown (2 of 10)]
[frame 21/24]
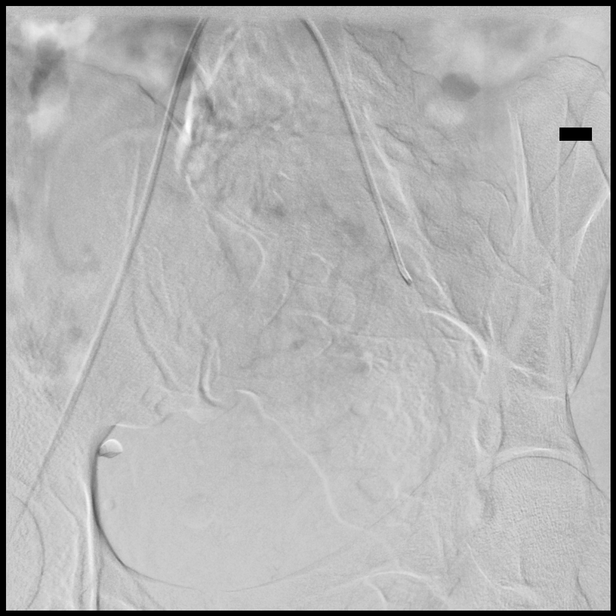

[Series 6: care body 4 · 1 of 14 frames shown (3 of 10)]
[frame 12/14]
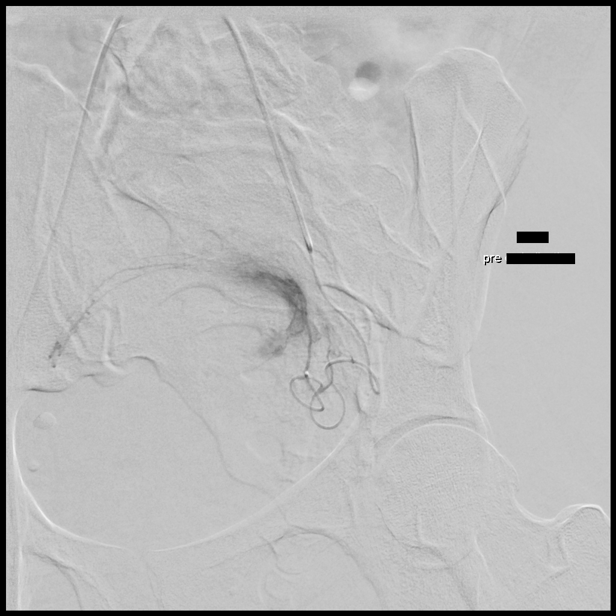

[Series 8: care body 4 · 1 of 34 frames shown (4 of 10)]
[frame 6/34]
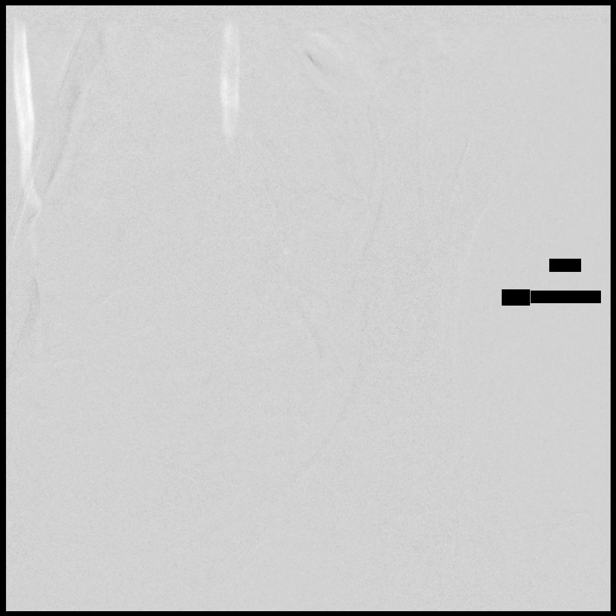

[Series 9: care body 4 · 1 of 23 frames shown (5 of 10)]
[frame 1/23]
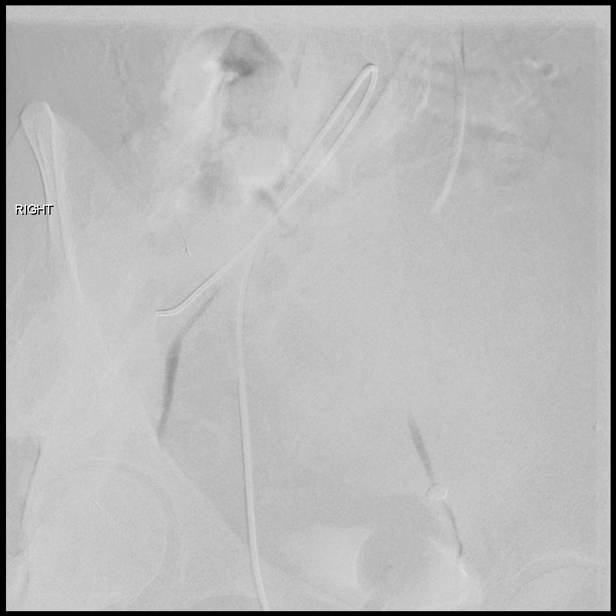

[Series 10: care body 4 · 1 of 19 frames shown (6 of 10)]
[frame 3/19]
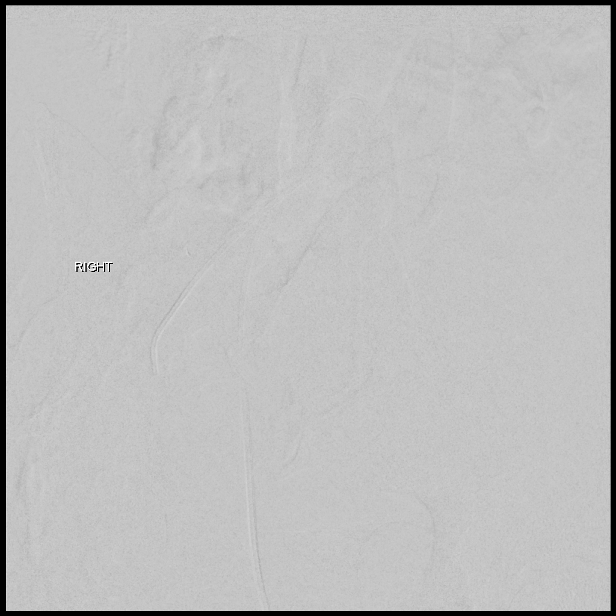

[Series 11: care body 4 · 1 of 16 frames shown (7 of 10)]
[frame 3/16]
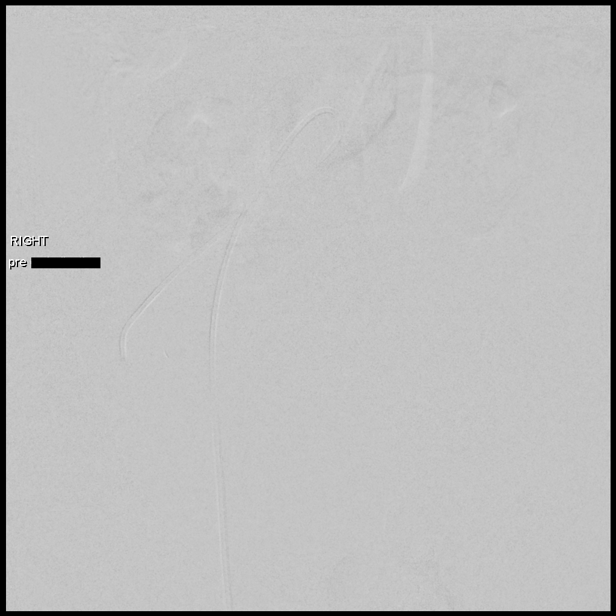

[Series 12: care body 4 · 1 of 21 frames shown (8 of 10)]
[frame 11/21]
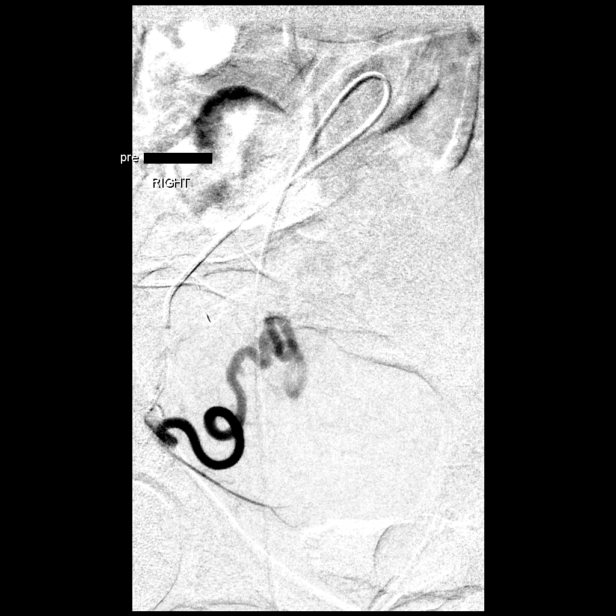

[Series 13: care body 4 · 1 of 26 frames shown (9 of 10)]
[frame 14/26]
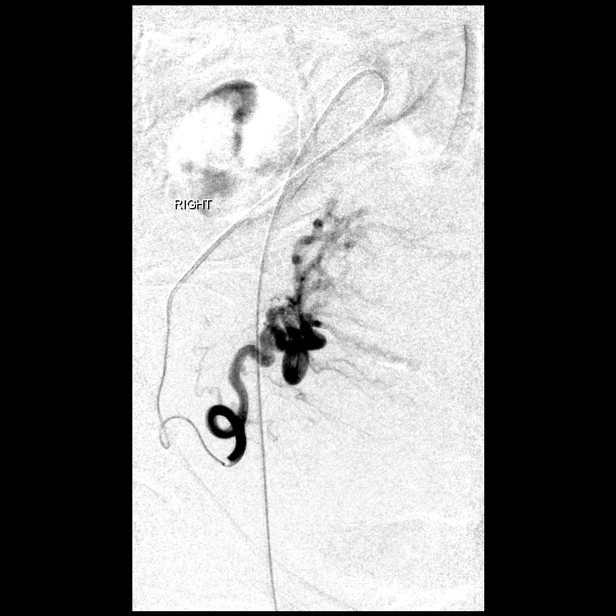

[Series 14: care body 4 · 1 of 47 frames shown (10 of 10)]
[frame 40/47]
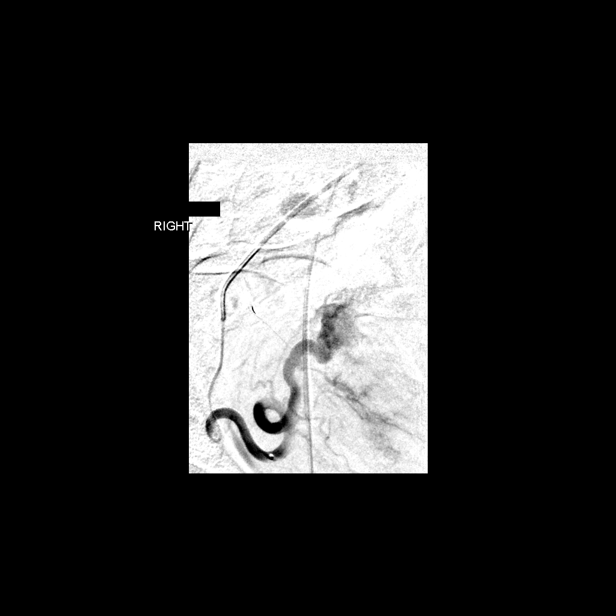

[Series 15: fl - angio · 1 of 9 frames shown]
[frame 8/9]
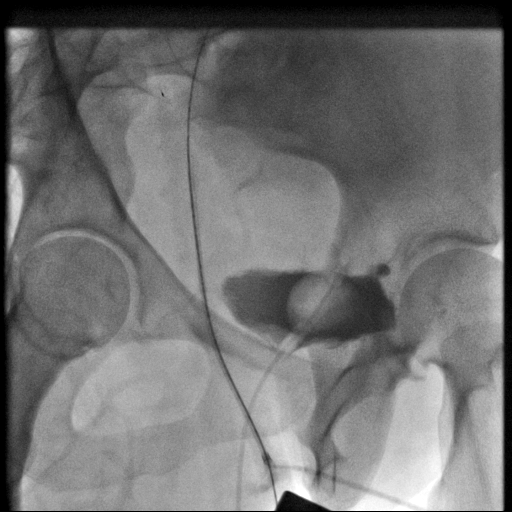

[Series 300: ld dsa body · 1 of 4 slices shown]
[im 4/4]
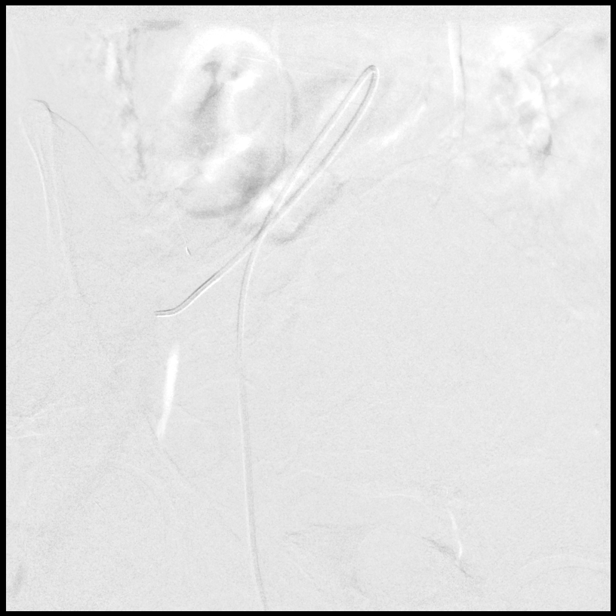

[12 of 24 positions shown; findings below may reference images not displayed]

EXAM:
IR ULTRASOUND GUIDANCE VASC ACCESS RIGHT; IR FLUORO GUIDE NEEDLE
PLACEMENT /BIOPSY; PELVIC SELECTIVE ARTERIOGRAPHY; IR EMBO TUMOR
ORGAN ISCHEMIA INFARCT INC GUIDE ROADMAPPING; ADDITIONAL
ARTERIOGRAPHY

1. Superior hypogastric nerve block under fluoroscopic guidance
2. Vascular access, right common femoral artery
3. Catheterization of the left internal iliac artery with
arteriogram
4. Catheterization of the left uterine artery with arteriogram
5. Particle embolization of the left uterine artery to near stasis
6. Catheterization of the right internal iliac artery with
arteriogram
7. Catheterization of the right uterine artery with arteriogram
8. Particle embolization of the right uterine artery to near stasis

MEDICATIONS:
1 g Rocephin. The antibiotic was administered within 1 hour of the
procedure. 30 mg Toradol also administered IV during the procedure.

ANESTHESIA/SEDATION:
Moderate (conscious) sedation was employed during this procedure. A
total of Versed 6 mg and Fentanyl 200 mcg was administered
intravenously.

Moderate Sedation Time: 61 minutes. The patient's level of
consciousness and vital signs were monitored continuously by
radiology nursing throughout the procedure under my direct
supervision.

CONTRAST:  58mL OMNIPAQUE IOHEXOL 300 MG/ML SOLN, 30mL OMNIPAQUE
IOHEXOL 300 MG/ML SOLN

FLUOROSCOPY TIME:  Fluoroscopy Time: 13 minutes 36 seconds (6685
mGy).

COMPLICATIONS:
None immediate.



The right common femoral artery was interrogated with ultrasound and
found to be widely patent. An image was obtained and stored for the
medical record. Local anesthesia was attained by infiltration with
1% lidocaine. A small dermatotomy was made. Under real-time
sonographic guidance, the vessel was punctured with a 21 gauge
micropuncture needle. Using standard technique, the initial micro
needle was exchanged over a 0.018 micro wire for a transitional 4
French micro sheath. The micro sheath was then exchanged over a
0.035 wire for a 5 French vascular sheath.

A C2 cobra catheter was advanced over a Bentson wire up in over the
aortic bifurcation and into the left internal iliac artery. The
catheter was flushed and capped. With the catheter defining the
aortic bifurcation and iliac arteries, attention was next turned
toward performing the superior hypogastric nerve block.

The image intensifier was angled in till the L5 vertebral body could
be viewed en face. Local anesthesia was attained by infiltration
with 1% lidocaine. A 22 gauge 15 cm Chiba needle was then advanced
under real-time fluoroscopic guidance onto the anterior surface of
the L5 vertebral body. A small amount of contrast was injected in
the AP and lateral projections confirming that the needle tip was in
the retroperitoneal pre-spinal space. There is no evidence of venous
extravasation. Next, a total of 20 mL ropivacaine was slowly
injected while maintaining forward pressure on the needle. Vital
signs were carefully monitored. There is no evidence of
intravascular injection. The patient tolerated the injection well.

The needle was removed. Attention was again turned to the
embolization portion of the procedure. An arteriogram was performed
from the catheter within the left internal iliac artery. A
hypertrophic uterine artery is readily identified. A renegade Hj Razali Sijie
microcatheter was successfully navigated over a Fathom 16 wire into
the horizontal segment of the left uterine artery. Arteriography was
performed confirming catheter position. Particle embolization was
then performed using 1 vial of 500-700 micron embospheres and
vials of 700-900 micron embospheres. Post embolization contrast
injection demonstrates an appropriate angiographic endpoint of near
stasis.

The microcatheter was removed. The 5 French C2 cobra catheter was
then formed into Ghionawiw Almayhu loop in used to select the ipsilateral
right internal iliac artery. Arteriography was performed. Again,
there is a hypertrophic uterine artery. The microcatheter was
reintroduced over the Fathom wire and advanced into the horizontal
segment of the right uterine artery. Arteriography was performed
confirming microcatheter position. Particle embolization was then
performed using 1 vial of 500-700 micron embospheres and 2.25 vials
of 700-900 micron embospheres. Post embolization contrast injection
demonstrates an appropriate angiographic endpoint of near stasis.

The catheters were removed. A limited right common femoral
arteriogram was then performed through the sheath confirming common
femoral arterial access. Hemostasis was attained with the assistance
of a 6 French Angio-Seal device.
IMPRESSION: 1. Successful fluoroscopically guided superior hypogastric nerve
block.
2. Successful bilateral uterine artery embolization.

## 2020-07-16 DIAGNOSIS — D485 Neoplasm of uncertain behavior of skin: Secondary | ICD-10-CM | POA: Diagnosis not present

## 2020-07-25 DIAGNOSIS — D485 Neoplasm of uncertain behavior of skin: Secondary | ICD-10-CM | POA: Diagnosis not present

## 2020-08-01 DIAGNOSIS — Z4802 Encounter for removal of sutures: Secondary | ICD-10-CM | POA: Diagnosis not present

## 2020-08-12 DIAGNOSIS — D215 Benign neoplasm of connective and other soft tissue of pelvis: Secondary | ICD-10-CM | POA: Diagnosis not present

## 2020-08-19 DIAGNOSIS — D219 Benign neoplasm of connective and other soft tissue, unspecified: Secondary | ICD-10-CM | POA: Diagnosis not present

## 2020-10-11 DIAGNOSIS — Z111 Encounter for screening for respiratory tuberculosis: Secondary | ICD-10-CM | POA: Diagnosis not present

## 2021-01-16 DIAGNOSIS — M25511 Pain in right shoulder: Secondary | ICD-10-CM | POA: Diagnosis not present

## 2021-02-03 DIAGNOSIS — M25511 Pain in right shoulder: Secondary | ICD-10-CM | POA: Diagnosis not present

## 2021-02-18 DIAGNOSIS — Z01419 Encounter for gynecological examination (general) (routine) without abnormal findings: Secondary | ICD-10-CM | POA: Diagnosis not present

## 2021-04-22 DIAGNOSIS — D215 Benign neoplasm of connective and other soft tissue of pelvis: Secondary | ICD-10-CM | POA: Diagnosis not present

## 2021-05-05 DIAGNOSIS — D219 Benign neoplasm of connective and other soft tissue, unspecified: Secondary | ICD-10-CM | POA: Diagnosis not present

## 2021-06-17 ENCOUNTER — Other Ambulatory Visit: Payer: Self-pay

## 2021-06-17 ENCOUNTER — Ambulatory Visit (INDEPENDENT_AMBULATORY_CARE_PROVIDER_SITE_OTHER): Payer: Medicaid Other | Admitting: Family Medicine

## 2021-06-17 ENCOUNTER — Encounter: Payer: Self-pay | Admitting: Family Medicine

## 2021-06-17 ENCOUNTER — Other Ambulatory Visit (HOSPITAL_COMMUNITY)
Admission: RE | Admit: 2021-06-17 | Discharge: 2021-06-17 | Disposition: A | Discharge status: Home / Self Care | Payer: Medicaid Other | Source: Ambulatory Visit | Attending: Family Medicine | Admitting: Family Medicine

## 2021-06-17 VITALS — BP 107/80 | HR 91 | Wt 208.7 lb

## 2021-06-17 DIAGNOSIS — Z1231 Encounter for screening mammogram for malignant neoplasm of breast: Secondary | ICD-10-CM

## 2021-06-17 DIAGNOSIS — Z3202 Encounter for pregnancy test, result negative: Secondary | ICD-10-CM

## 2021-06-17 DIAGNOSIS — Z01419 Encounter for gynecological examination (general) (routine) without abnormal findings: Secondary | ICD-10-CM

## 2021-06-17 DIAGNOSIS — N898 Other specified noninflammatory disorders of vagina: Secondary | ICD-10-CM

## 2021-06-17 DIAGNOSIS — N926 Irregular menstruation, unspecified: Secondary | ICD-10-CM

## 2021-06-17 DIAGNOSIS — Z113 Encounter for screening for infections with a predominantly sexual mode of transmission: Secondary | ICD-10-CM | POA: Diagnosis not present

## 2021-06-17 DIAGNOSIS — N761 Subacute and chronic vaginitis: Secondary | ICD-10-CM | POA: Insufficient documentation

## 2021-06-17 LAB — POCT URINALYSIS DIP (DEVICE)
Bilirubin Urine: NEGATIVE
Glucose, UA: NEGATIVE mg/dL
Ketones, ur: NEGATIVE mg/dL
Leukocytes,Ua: NEGATIVE
Nitrite: NEGATIVE
Protein, ur: NEGATIVE mg/dL
Specific Gravity, Urine: >=1.03 (ref 1.005–1.030)
Urobilinogen, UA: 0.2 mg/dL (ref 0.0–1.0)
pH: 5.5 (ref 5.0–8.0)

## 2021-06-17 LAB — POCT PREGNANCY, URINE: Preg Test, Ur: NEGATIVE

## 2021-06-17 NOTE — Progress Notes (Signed)
? ?GYNECOLOGY OFFICE VISIT NOTE ? ?History:  ? Becky Larson is a 41 y.o. [phone] here today for an annual exam.  ? ?She reports she is doing well overall. She is having some vaginal odor and would like to make sure she does not have BV. She is also requesting STI screening as well.  ? ?Reports normal, regular periods. Occasional skips a month though. LMP in early February. Not using any form of contraception currently. Open to conceiving.  ? ?She reports some intermittent breast tenderness. Mostly occurring around her menses. No lumps or masses noted in her breasts. No FHx of breast cancer. She has no other concerns at this time.  ? ?Past Medical History:  ?Diagnosis Date  ? Anemia   ? due to blood loss  ? Dyspnea   ? due to exertion   ? Ectopic pregnancy   ? Right  ? Fibroid uterus   ? ? ?Past Surgical History:  ?Procedure Laterality Date  ? CESAREAN SECTION    ? HYSTEROSCOPY WITH D & C N/A 01/05/2018  ? Procedure: DILATATION AND CURETTAGE /HYSTEROSCOPY/MYOMECTOMY/POLYPECTOMY;  Surgeon: Aletha Halim, MD;  Location: [facility];  Service: Gynecology;  Laterality: N/A;  ? IR ANGIOGRAM PELVIS SELECTIVE OR SUPRASELECTIVE  01/13/2019  ? IR ANGIOGRAM PELVIS SELECTIVE OR SUPRASELECTIVE  01/13/2019  ? IR ANGIOGRAM SELECTIVE EACH ADDITIONAL VESSEL  01/13/2019  ? IR ANGIOGRAM SELECTIVE EACH ADDITIONAL VESSEL  01/13/2019  ? IR EMBO TUMOR ORGAN ISCHEMIA INFARCT INC GUIDE ROADMAPPING  01/13/2019  ? IR FLUORO GUIDED NEEDLE PLC ASPIRATION/INJECTION LOC  01/13/2019  ? IR US GUIDE VASC ACCESS RIGHT  01/13/2019  ? LAPAROSCOPIC SALPINGOOPHERECTOMY Right   ? ectopic  ? MYOMECTOMY N/A 01/25/2019  ? Procedure: VAGINAL MYOMECTOMY;  Surgeon: Lafonda Mosses, MD;  Location: [facility];  Service: Gynecology;  Laterality: N/A;  ? ?The following portions of the patient's history were reviewed and updated as appropriate: allergies, current medications, past family history, past medical history, past social history, past  surgical history and problem list.  ? ?Health Maintenance:  Normal pap and negative HRHPV on 02/02/2019.  Previous pap prior to this NILM with positive HPV on 11/25/2017. Repeat due today. Due for screening mammogram.  ? ?Review of Systems:  ?Pertinent items noted in HPI and remainder of comprehensive ROS otherwise negative. ? ?Physical Exam:  ?BP 107/80   Pulse 91   Wt 208 lb 11.2 oz (94.7 kg)   LMP 04/23/2021 (Approximate)   BMI 33.69 kg/m?  ? ?Performed in the presence of a chaperone. ? ?CONSTITUTIONAL: Well-developed, well-nourished female in no acute distress.  ?HEENT:  Normocephalic, atraumatic. EOMI, conjunctivae clear. ?BREAST: Normal shape and contour bilaterally, no masses palpated, no axillary or supraclavicular LAD, no nipple discharge or superficial skin changes.  ?CARDIOVASCULAR: Normal heart rate noted. ?RESPIRATORY: Normal work of breathing on room air.  ?ABDOMEN: Soft, nontender, nondistended. ?PELVIC: Normal appearing external genitalia; normal urethral meatus; normal appearing vaginal mucosa and cervix.  No abnormal discharge noted.  ?NEUROLOGIC: Alert and oriented to person, place, and time.  ?PSYCHIATRIC: Normal mood and affect. ? ?Labs and Imaging ?Results for orders placed or performed in visit on 06/17/21 (from the past 168 hour(s))  ?POCT urinalysis dip (device)  ? Collection Time: 06/17/21 10:38 AM  ?Result Value Ref Range  ? Glucose, UA NEGATIVE NEGATIVE mg/dL  ? Bilirubin Urine NEGATIVE NEGATIVE  ? Ketones, ur NEGATIVE NEGATIVE mg/dL  ? Specific Gravity, Urine >=1.030 1.005 - 1.030  ? Hgb urine dipstick TRACE (A)  NEGATIVE  ? pH 5.5 5.0 - 8.0  ? Protein, ur NEGATIVE NEGATIVE mg/dL  ? Urobilinogen, UA 0.2 0.0 - 1.0 mg/dL  ? Nitrite NEGATIVE NEGATIVE  ? Leukocytes,Ua NEGATIVE NEGATIVE  ?Pregnancy, urine POC  ? Collection Time: 06/17/21 10:43 AM  ?Result Value Ref Range  ? Preg Test, Ur NEGATIVE NEGATIVE  ? ?Assessment and Plan:  ? ?1. Women's annual routine gynecological examination ?2.  Routine screening for STI (sexually transmitted infection) ?Normal exam as above. Pap smear obtained in addition to STI screening. Will follow up results. Follow up visit in 1 year for next annual exam, sooner as needed.  ?- Cytology - PAP( [facility]) ?- Hepatitis B Surface AntiGEN ?- Hepatitis C Antibody ?- RPR ?- HIV antibody (with reflex) ?- Cervicovaginal ancillary only( [facility]) ? ?3. Encounter for screening mammogram for malignant neoplasm of breast ?Normal breast exam. Discussed that breast tenderness near menses is common and reviewed ways to help with this. Screening mammogram ordered as below. Will follow up results.  ?- MM 3D SCREEN BREAST BILATERAL; Future ? ?4. Vaginal odor ?Vaginal swabs obtained, will follow up results and treat as indicated. POCT UA non-infectious appearing.  ?- Cervicovaginal ancillary only( [facility]) ?- POCT urinalysis dip (device) ? ?5. Irregular menses ?Negative UPT. Patient reports she is open to conceiving, does not wish to pursue contraception at this time. Counseling provided.  ?- Pregnancy, urine POC ? ?Routine preventative health maintenance measures emphasized. ? ?Please refer to After Visit Summary for other counseling recommendations.  ? ?Return in about 1 year (around 06/18/2022) for next annual exam, sooner as needed.   ? ?Vilma Meckel, MD ?OB Fellow, Faculty Practice ?[place] [facility] ?06/17/2021 7:37 PM ? ?

## 2021-06-18 LAB — RPR: RPR Ser Ql: NONREACTIVE

## 2021-06-18 LAB — HEPATITIS C ANTIBODY: Hep C Virus Ab: NONREACTIVE

## 2021-06-18 LAB — HIV ANTIBODY (ROUTINE TESTING W REFLEX): HIV Screen 4th Generation wRfx: NONREACTIVE

## 2021-06-18 LAB — HEPATITIS B SURFACE ANTIGEN: Hepatitis B Surface Ag: NEGATIVE

## 2021-06-19 ENCOUNTER — Other Ambulatory Visit: Payer: Self-pay | Admitting: Family Medicine

## 2021-06-19 DIAGNOSIS — B3731 Acute candidiasis of vulva and vagina: Secondary | ICD-10-CM

## 2021-06-19 LAB — CERVICOVAGINAL ANCILLARY ONLY
Bacterial Vaginitis (gardnerella): NEGATIVE
Candida Glabrata: NEGATIVE
Candida Vaginitis: POSITIVE — AB
Chlamydia: NEGATIVE
Comment: NEGATIVE
Comment: NEGATIVE
Comment: NEGATIVE
Comment: NEGATIVE
Comment: NEGATIVE
Comment: NORMAL
Neisseria Gonorrhea: NEGATIVE
Trichomonas: NEGATIVE

## 2021-06-19 MED ORDER — FLUCONAZOLE 150 MG PO TABS: 150.0000 mg | ORAL_TABLET | Freq: Once | ORAL | 0 refills | Status: AC

## 2021-06-20 LAB — CYTOLOGY - PAP
Comment: NEGATIVE
Comment: NEGATIVE
Comment: NEGATIVE
Diagnosis: UNDETERMINED — AB
HPV 16: NEGATIVE
HPV 18 / 45: NEGATIVE
High risk HPV: POSITIVE — AB

## 2021-06-23 ENCOUNTER — Telehealth: Payer: Self-pay | Admitting: *Deleted

## 2021-06-23 NOTE — Telephone Encounter (Addendum)
-----   Message from Genia Del, MD sent at 06/22/2021  4:35 PM EDT ----- ?I have previously sent a message to patient regarding results but it does not look like she has read them. Can we please call her to make sure she receives this message?  ? ?Let her know that I have sent in medication for a yeast infection that she had on her vaginal swab. Her STD screening was negative. Her pap smear shows that she is positive for HPV and she has some atypical cells present. We can repeat her pap smear in one year to reassess this. She is also a candidate for the HPV vaccine if she wishes. Should she want this, please let me know so I can send a message to coordinate this.  ? ?Thank you!  ? ?-Becky Larson  ? ?4/3  0945 Per chart review, pt saw message regarding yeast and Rx sent.  I called pt and verified her understanding of yeast results. We discussed STI and Pap results as well as recommendation for repeat Pap in one year. Pt voiced understanding and had no questions.  ?

## 2021-06-30 ENCOUNTER — Ambulatory Visit
Admission: RE | Admit: 2021-06-30 | Discharge: 2021-06-30 | Disposition: A | Discharge status: Home / Self Care | Payer: Medicaid Other | Source: Ambulatory Visit | Attending: Family Medicine | Admitting: Family Medicine

## 2021-06-30 DIAGNOSIS — Z1231 Encounter for screening mammogram for malignant neoplasm of breast: Secondary | ICD-10-CM

## 2021-10-28 DIAGNOSIS — N760 Acute vaginitis: Secondary | ICD-10-CM | POA: Diagnosis not present

## 2021-10-28 DIAGNOSIS — N898 Other specified noninflammatory disorders of vagina: Secondary | ICD-10-CM | POA: Diagnosis not present

## 2021-10-28 DIAGNOSIS — Z113 Encounter for screening for infections with a predominantly sexual mode of transmission: Secondary | ICD-10-CM | POA: Diagnosis not present

## 2021-11-27 ENCOUNTER — Other Ambulatory Visit: Payer: Self-pay | Admitting: Family Medicine

## 2021-11-27 DIAGNOSIS — R1010 Upper abdominal pain, unspecified: Secondary | ICD-10-CM

## 2021-11-27 DIAGNOSIS — Z13220 Encounter for screening for lipoid disorders: Secondary | ICD-10-CM | POA: Diagnosis not present

## 2021-11-27 DIAGNOSIS — Z0000 Encounter for general adult medical examination without abnormal findings: Secondary | ICD-10-CM | POA: Diagnosis not present

## 2021-11-27 DIAGNOSIS — D72829 Elevated white blood cell count, unspecified: Secondary | ICD-10-CM | POA: Diagnosis not present

## 2021-11-27 DIAGNOSIS — Z136 Encounter for screening for cardiovascular disorders: Secondary | ICD-10-CM | POA: Diagnosis not present

## 2021-11-27 DIAGNOSIS — Z23 Encounter for immunization: Secondary | ICD-10-CM | POA: Diagnosis not present

## 2021-11-27 DIAGNOSIS — E669 Obesity, unspecified: Secondary | ICD-10-CM | POA: Diagnosis not present

## 2021-12-02 ENCOUNTER — Ambulatory Visit
Admission: RE | Admit: 2021-12-02 | Discharge: 2021-12-02 | Disposition: A | Discharge status: Home / Self Care | Payer: Medicaid Other | Source: Ambulatory Visit | Attending: Family Medicine | Admitting: Family Medicine

## 2021-12-02 DIAGNOSIS — R1010 Upper abdominal pain, unspecified: Secondary | ICD-10-CM | POA: Diagnosis not present

## 2021-12-05 ENCOUNTER — Other Ambulatory Visit: Payer: Self-pay | Admitting: Family Medicine

## 2021-12-05 DIAGNOSIS — K760 Fatty (change of) liver, not elsewhere classified: Secondary | ICD-10-CM

## 2021-12-05 DIAGNOSIS — R1010 Upper abdominal pain, unspecified: Secondary | ICD-10-CM

## 2021-12-12 ENCOUNTER — Other Ambulatory Visit: Payer: Medicaid Other

## 2021-12-26 DIAGNOSIS — N898 Other specified noninflammatory disorders of vagina: Secondary | ICD-10-CM | POA: Diagnosis not present

## 2021-12-26 DIAGNOSIS — N760 Acute vaginitis: Secondary | ICD-10-CM | POA: Diagnosis not present

## 2021-12-26 DIAGNOSIS — Z87892 Personal history of anaphylaxis: Secondary | ICD-10-CM | POA: Diagnosis not present

## 2021-12-26 DIAGNOSIS — B9689 Other specified bacterial agents as the cause of diseases classified elsewhere: Secondary | ICD-10-CM | POA: Diagnosis not present

## 2022-01-05 ENCOUNTER — Other Ambulatory Visit: Payer: Self-pay | Admitting: Internal Medicine

## 2022-01-05 DIAGNOSIS — K760 Fatty (change of) liver, not elsewhere classified: Secondary | ICD-10-CM

## 2022-01-05 DIAGNOSIS — R110 Nausea: Secondary | ICD-10-CM | POA: Diagnosis not present

## 2022-01-05 DIAGNOSIS — R1012 Left upper quadrant pain: Secondary | ICD-10-CM

## 2022-01-08 ENCOUNTER — Ambulatory Visit (HOSPITAL_COMMUNITY)
Admission: RE | Admit: 2022-01-08 | Discharge: 2022-01-08 | Disposition: A | Discharge status: Home / Self Care | Payer: Medicaid Other | Source: Ambulatory Visit | Attending: Internal Medicine | Admitting: Internal Medicine

## 2022-01-08 DIAGNOSIS — K760 Fatty (change of) liver, not elsewhere classified: Secondary | ICD-10-CM | POA: Insufficient documentation

## 2022-01-08 DIAGNOSIS — R1012 Left upper quadrant pain: Secondary | ICD-10-CM | POA: Insufficient documentation

## 2022-01-08 DIAGNOSIS — R110 Nausea: Secondary | ICD-10-CM | POA: Insufficient documentation

## 2022-03-02 DIAGNOSIS — Z111 Encounter for screening for respiratory tuberculosis: Secondary | ICD-10-CM | POA: Diagnosis not present

## 2022-03-09 DIAGNOSIS — K760 Fatty (change of) liver, not elsewhere classified: Secondary | ICD-10-CM | POA: Diagnosis not present

## 2022-03-09 DIAGNOSIS — K219 Gastro-esophageal reflux disease without esophagitis: Secondary | ICD-10-CM | POA: Diagnosis not present

## 2022-03-09 DIAGNOSIS — R194 Change in bowel habit: Secondary | ICD-10-CM | POA: Diagnosis not present

## 2022-04-02 DIAGNOSIS — Z86018 Personal history of other benign neoplasm: Secondary | ICD-10-CM | POA: Diagnosis not present

## 2022-04-02 DIAGNOSIS — N923 Ovulation bleeding: Secondary | ICD-10-CM | POA: Diagnosis not present

## 2022-04-02 DIAGNOSIS — N940 Mittelschmerz: Secondary | ICD-10-CM | POA: Diagnosis not present

## 2022-04-02 DIAGNOSIS — N898 Other specified noninflammatory disorders of vagina: Secondary | ICD-10-CM | POA: Diagnosis not present

## 2022-04-15 DIAGNOSIS — R109 Unspecified abdominal pain: Secondary | ICD-10-CM | POA: Diagnosis not present

## 2022-04-15 DIAGNOSIS — D259 Leiomyoma of uterus, unspecified: Secondary | ICD-10-CM | POA: Diagnosis not present

## 2022-04-15 DIAGNOSIS — N926 Irregular menstruation, unspecified: Secondary | ICD-10-CM | POA: Diagnosis not present

## 2022-04-15 DIAGNOSIS — R102 Pelvic and perineal pain: Secondary | ICD-10-CM | POA: Diagnosis not present

## 2022-04-30 DIAGNOSIS — N83291 Other ovarian cyst, right side: Secondary | ICD-10-CM | POA: Diagnosis not present

## 2022-04-30 DIAGNOSIS — N83292 Other ovarian cyst, left side: Secondary | ICD-10-CM | POA: Diagnosis not present

## 2022-04-30 DIAGNOSIS — N926 Irregular menstruation, unspecified: Secondary | ICD-10-CM | POA: Diagnosis not present

## 2022-04-30 DIAGNOSIS — R102 Pelvic and perineal pain: Secondary | ICD-10-CM | POA: Diagnosis not present

## 2022-04-30 DIAGNOSIS — D259 Leiomyoma of uterus, unspecified: Secondary | ICD-10-CM | POA: Diagnosis not present

## 2022-06-02 DIAGNOSIS — N83291 Other ovarian cyst, right side: Secondary | ICD-10-CM | POA: Diagnosis not present

## 2022-06-02 DIAGNOSIS — D259 Leiomyoma of uterus, unspecified: Secondary | ICD-10-CM | POA: Diagnosis not present

## 2022-06-02 DIAGNOSIS — N926 Irregular menstruation, unspecified: Secondary | ICD-10-CM | POA: Diagnosis not present

## 2022-06-02 DIAGNOSIS — R102 Pelvic and perineal pain: Secondary | ICD-10-CM | POA: Diagnosis not present

## 2022-06-02 DIAGNOSIS — N83292 Other ovarian cyst, left side: Secondary | ICD-10-CM | POA: Diagnosis not present

## 2022-06-09 DIAGNOSIS — N898 Other specified noninflammatory disorders of vagina: Secondary | ICD-10-CM | POA: Diagnosis not present

## 2022-06-09 DIAGNOSIS — N926 Irregular menstruation, unspecified: Secondary | ICD-10-CM | POA: Diagnosis not present

## 2022-06-09 DIAGNOSIS — Z3202 Encounter for pregnancy test, result negative: Secondary | ICD-10-CM | POA: Diagnosis not present

## 2022-06-09 DIAGNOSIS — D259 Leiomyoma of uterus, unspecified: Secondary | ICD-10-CM | POA: Diagnosis not present

## 2022-12-01 DIAGNOSIS — N898 Other specified noninflammatory disorders of vagina: Secondary | ICD-10-CM | POA: Diagnosis not present

## 2022-12-01 DIAGNOSIS — F322 Major depressive disorder, single episode, severe without psychotic features: Secondary | ICD-10-CM | POA: Diagnosis not present

## 2022-12-01 DIAGNOSIS — D72829 Elevated white blood cell count, unspecified: Secondary | ICD-10-CM | POA: Diagnosis not present

## 2022-12-01 DIAGNOSIS — E785 Hyperlipidemia, unspecified: Secondary | ICD-10-CM | POA: Diagnosis not present

## 2022-12-01 DIAGNOSIS — Z1331 Encounter for screening for depression: Secondary | ICD-10-CM | POA: Diagnosis not present

## 2022-12-01 DIAGNOSIS — Z0000 Encounter for general adult medical examination without abnormal findings: Secondary | ICD-10-CM | POA: Diagnosis not present

## 2022-12-01 DIAGNOSIS — E669 Obesity, unspecified: Secondary | ICD-10-CM | POA: Diagnosis not present

## 2023-03-29 DIAGNOSIS — Z113 Encounter for screening for infections with a predominantly sexual mode of transmission: Secondary | ICD-10-CM | POA: Diagnosis not present

## 2023-03-29 DIAGNOSIS — N760 Acute vaginitis: Secondary | ICD-10-CM | POA: Diagnosis not present

## 2023-03-29 DIAGNOSIS — Z87892 Personal history of anaphylaxis: Secondary | ICD-10-CM | POA: Diagnosis not present

## 2023-12-07 DIAGNOSIS — E669 Obesity, unspecified: Secondary | ICD-10-CM | POA: Diagnosis not present

## 2023-12-07 DIAGNOSIS — E785 Hyperlipidemia, unspecified: Secondary | ICD-10-CM | POA: Diagnosis not present

## 2023-12-07 DIAGNOSIS — D72829 Elevated white blood cell count, unspecified: Secondary | ICD-10-CM | POA: Diagnosis not present

## 2024-01-11 ENCOUNTER — Encounter (HOSPITAL_BASED_OUTPATIENT_CLINIC_OR_DEPARTMENT_OTHER): Payer: Self-pay | Admitting: Certified Nurse Midwife

## 2024-01-11 ENCOUNTER — Ambulatory Visit (INDEPENDENT_AMBULATORY_CARE_PROVIDER_SITE_OTHER): Admitting: Certified Nurse Midwife

## 2024-01-11 VITALS — BP 129/88 | HR 93 | Ht 66.0 in | Wt 206.0 lb

## 2024-01-11 DIAGNOSIS — F4321 Adjustment disorder with depressed mood: Secondary | ICD-10-CM | POA: Diagnosis not present

## 2024-01-11 DIAGNOSIS — Z1331 Encounter for screening for depression: Secondary | ICD-10-CM | POA: Diagnosis not present

## 2024-01-11 DIAGNOSIS — N951 Menopausal and female climacteric states: Secondary | ICD-10-CM | POA: Insufficient documentation

## 2024-01-11 DIAGNOSIS — D259 Leiomyoma of uterus, unspecified: Secondary | ICD-10-CM

## 2024-01-11 LAB — POCT URINE PREGNANCY: Preg Test, Ur: NEGATIVE

## 2024-01-11 MED ORDER — ESCITALOPRAM OXALATE 10 MG PO TABS: 10.0000 mg | ORAL_TABLET | Freq: Every day | ORAL | 12 refills | Status: DC

## 2024-01-11 NOTE — Progress Notes (Signed)
 43 y.o. [id number] Married Native [place] or Other Pacific Islander female here for  problem visit (skipped period, hx fibroids).  Pt lost her 15yo son Walton in 2024 at the age of 28 from an accidental overdose fentanyl . She is grieving intensely. Has difficulty sleeping at night. States she has a hx fibroids (very large fibroid).  They do not use contraception but have not conceived in 11 years. States she had Ectopic Pregnancy 11 years ago and unilateral oophorectomy or salpingectomy was performed. Hx CS x 1 and Vaginal Delivery x 1. Her last menstrual period was on 11/15/23. States she now skips periods at times.  Most recent evaluation fibroids ~2020, s/p uterine artery embolization. Pt states a pedunculated prolapsed fibroid developed that had to be removed surgery.   Patient's last menstrual period was 11/15/2023.          Sexually active: Yes.    The current method of family planning is none. UPT Negative. Does not desire contraception.  Exercising: Yes.    walking Smoker:  No  Health Maintenance: Pap:  06/17/2021 ASCUS HRHPV History of abnormal Pap:  yes MMG:  06/30/2021 Birads 1 neg Colonoscopy:  N/A BMD:   N/A    reports that she has never smoked. She has never used smokeless tobacco. She reports current alcohol use. She reports current drug use. Drug: Marijuana.  Past Medical History:  Diagnosis Date   Anemia    due to blood loss   Dyspnea    due to exertion    Ectopic pregnancy    Right   Fibroid uterus     Past Surgical History:  Procedure Laterality Date   CESAREAN SECTION     HYSTEROSCOPY WITH D & C N/A 01/05/2018   Procedure: DILATATION AND CURETTAGE /HYSTEROSCOPY/MYOMECTOMY/POLYPECTOMY;  Surgeon: Izell Harari, MD;  Location: [facility];  Service: Gynecology;  Laterality: N/A;   IR ANGIOGRAM PELVIS SELECTIVE OR SUPRASELECTIVE  01/13/2019   IR ANGIOGRAM PELVIS SELECTIVE OR SUPRASELECTIVE  01/13/2019   IR ANGIOGRAM SELECTIVE EACH ADDITIONAL  VESSEL  01/13/2019   IR ANGIOGRAM SELECTIVE EACH ADDITIONAL VESSEL  01/13/2019   IR EMBO TUMOR ORGAN ISCHEMIA INFARCT INC GUIDE ROADMAPPING  01/13/2019   IR FLUORO GUIDED NEEDLE PLC ASPIRATION/INJECTION LOC  01/13/2019   IR US  GUIDE VASC ACCESS RIGHT  01/13/2019   LAPAROSCOPIC SALPINGOOPHERECTOMY Right    ectopic   MYOMECTOMY N/A 01/25/2019   Procedure: VAGINAL MYOMECTOMY;  Surgeon: Viktoria Comer SAUNDERS, MD;  Location: [facility];  Service: Gynecology;  Laterality: N/A;    Current Outpatient Medications  Medication Sig Dispense Refill   EPINEPHrine 0.3 mg/0.3 mL IJ SOAJ injection See admin instructions.     ibuprofen  (ADVIL ) 600 MG tablet Take 600 mg by mouth 3 (three) times daily as needed.     ibuprofen  (ADVIL ) 800 MG tablet Take 800 mg by mouth every 6 (six) hours as needed. (Patient not taking: Reported on 02/28/2020)     No current facility-administered medications for this visit.    Family History  Problem Relation Age of Onset   Hypertension Father    Hypertension Paternal Grandfather    Heart attack Paternal Grandfather    Lung cancer Paternal Grandfather    Colon cancer Maternal Grandmother    Lung cancer Paternal Grandmother    Lung cancer Maternal Grandfather    Ovarian cancer Neg Hx    Uterine cancer Neg Hx    Cervical cancer Neg Hx    Breast cancer Neg Hx     ROS:  Constitutional: negative Genitourinary:negative  Exam:   BP 129/88 (BP Location: Right Arm, Patient Position: Sitting)   Pulse 93   Ht 5' 6 (1.676 m)   Wt 206 lb (93.4 kg)   LMP 11/15/2023   SpO2 97%   BMI 33.25 kg/m   Height: 5' 6 (167.6 cm)  General appearance: alert, cooperative and appears stated age Head: Normocephalic, without obvious abnormality, atraumatic Abdomen: soft, non-tender; bowel sounds normal; no masses,  no organomegaly Extremities: extremities normal, atraumatic, no cyanosis or edema Skin: Skin color, texture, turgor normal. No rashes or lesions Lymph nodes: Cervical,  supraclavicular, and axillary nodes normal. No abnormal inguinal nodes palpated Neurologic: Grossly normal   Assessment/Plan:  1. Uterine leiomyoma, unspecified location (Primary) - Hx Fibroids and pt reports enlarged uterus. Will RTO for Pelvic US  followed by pap smear/spec exam/bimanual.  - US  PELVIC COMPLETE WITH TRANSVAGINAL; Future  2. Grief - 30 minutes was spent providing emotional support to patient. She is intensely grieving the loss of her son Walton. She is agreeable to a trial of SSRI to help increase sense of calm and decrease anxiety.   3. Perimenopausal - Discussed s/s Perimenopause, may include skipped periods. - Pt will RTO for US  to evaluate uterus/ovaries/fibroids and follow-up w/ Arland Roller CNM for pap smear at that time.  Arland MARLA Roller

## 2024-02-02 ENCOUNTER — Encounter (HOSPITAL_BASED_OUTPATIENT_CLINIC_OR_DEPARTMENT_OTHER): Payer: Self-pay | Admitting: Obstetrics & Gynecology

## 2024-02-02 ENCOUNTER — Other Ambulatory Visit (HOSPITAL_BASED_OUTPATIENT_CLINIC_OR_DEPARTMENT_OTHER): Admitting: Obstetrics & Gynecology

## 2024-02-02 ENCOUNTER — Ambulatory Visit (INDEPENDENT_AMBULATORY_CARE_PROVIDER_SITE_OTHER): Payer: Self-pay | Admitting: Obstetrics & Gynecology

## 2024-02-02 ENCOUNTER — Other Ambulatory Visit (HOSPITAL_BASED_OUTPATIENT_CLINIC_OR_DEPARTMENT_OTHER)

## 2024-02-02 ENCOUNTER — Other Ambulatory Visit (HOSPITAL_COMMUNITY)
Admission: RE | Admit: 2024-02-02 | Discharge: 2024-02-02 | Disposition: A | Source: Ambulatory Visit | Attending: Obstetrics & Gynecology | Admitting: Obstetrics & Gynecology

## 2024-02-02 ENCOUNTER — Ambulatory Visit (INDEPENDENT_AMBULATORY_CARE_PROVIDER_SITE_OTHER)

## 2024-02-02 VITALS — BP 118/73 | HR 88 | Wt 209.0 lb

## 2024-02-02 DIAGNOSIS — Z1231 Encounter for screening mammogram for malignant neoplasm of breast: Secondary | ICD-10-CM

## 2024-02-02 DIAGNOSIS — Z124 Encounter for screening for malignant neoplasm of cervix: Secondary | ICD-10-CM | POA: Diagnosis not present

## 2024-02-02 DIAGNOSIS — N912 Amenorrhea, unspecified: Secondary | ICD-10-CM | POA: Diagnosis not present

## 2024-02-02 DIAGNOSIS — F4320 Adjustment disorder, unspecified: Secondary | ICD-10-CM

## 2024-02-02 DIAGNOSIS — D259 Leiomyoma of uterus, unspecified: Secondary | ICD-10-CM

## 2024-02-02 DIAGNOSIS — D251 Intramural leiomyoma of uterus: Secondary | ICD-10-CM

## 2024-02-02 MED ORDER — ESCITALOPRAM OXALATE 10 MG PO TABS: 10.0000 mg | ORAL_TABLET | Freq: Every day | ORAL | 2 refills | Status: AC

## 2024-02-02 NOTE — Progress Notes (Signed)
 Ultrasound f/u Patient name: Becky Larson MRN [id number]  Date of birth: Jan 24, 1981 Chief Complaint:   Follow-up  History of Present Illness:   Becky Larson is a 43 y.o. [phone] African-American female being seen today for discussion of ultrasound findings done due to history of uterine artery embolization.  Review of the chart and previous images show that in 2021 she had an ultrasound showing uterus measuring approximately 16 x 9 x 10 cm.  She had a focal intramural fibroid measuring 2.6 cm.  Also noted was a heterogenous mass within the endometrium measuring 8 x 7 x 6 cm.  She underwent uterine artery embolization.  She ended up expelling the fibroid vaginally went it necrosed after the embolization.  Patient reports her symptoms of enlarged uterus and bleeding significantly proved after that time.  She was seen in our office initially on 10/21 by CNM, Arland Lo.  Ultrasound follow-up was recommended.  Patient does still need a Pap obtained.  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  She also needs her mammogram updated.  At that visit she was started on Lexapro but the pharmacy never received the prescription.  She is grief reaction due to the loss of her son from last year.  She has tried sertraline but felt it made her too sleepy.  Upon asking about bleeding she reports has not had any since August.  She is having some hot flashes.  She denies any other abnormal symptoms.  She would be interested in HRT.  Ultrasound from today reviewed.  Uterus is about 9 x 5 x 5 cm.  There is an anterior fibroid measuring about 3 cm.  The endometrium is heterogenous and consistent with the prior history of endometrial ablation.  Ovaries are normal in appearance.  Endometrium measures 2 to 4 mm.  Cervix is normal.  No free fluid is noted.  Patient's last menstrual period was 11/15/2023.  Last pap 06/17/2021. Results were: ASCUS with +HR HPV, negaitve 16/18/45.  Review of Systems:   Pertinent items are  noted in HPI Denies any urinary or bowel changes or pelvic pain Pertinent History Reviewed:  Reviewed past medical,surgical, social and family history.  Reviewed problem list, medications and allergies. Physical Assessment:   Vitals:   02/02/24 1426  BP: 118/73  Pulse: 88  SpO2: 98%  Weight: 209 lb (94.8 kg)  Body mass index is 33.73 kg/m.        Physical Examination:   General appearance - well appearing, and in no distress  Mental status - alert, oriented to person, place, and time  Psych:  She has a normal mood and affect   Abdomen - soft, nontender, nondistended, no masses or organomegaly  Pelvic - VULVA: normal appearing vulva with no masses, tenderness or lesions   VAGINA: normal appearing vagina with normal color and discharge, no lesions   CERVIX: normal appearing cervix without discharge or lesions, no CMT.  Pap obtained.  Thin prep pap is done with HR HPV cotesting  UTERUS: uterus is felt to be normal size, shape, consistency and nontender   ADNEXA: No adnexal masses or tenderness noted.   Assessment & Plan:  1. Intramural leiomyoma of uterus (Primary) -Images reviewed with patient.  Single fibroid noted.  Do not feel any treatment for this is needed  2. Amenorrhea -Will evaluate for her lack of bleeding since August.  She does have some perimenopausal symptoms. - Follicle stimulating hormone - Estradiol - Prolactin - Beta hCG quant (ref  lab)  3. Cervical cancer screening - Pap updated today. - Cytology - PAP( [facility])  4. Grief reaction -Lexapro Rx to pharmacy.  Side effects reviewed.  She is going to start with 10 mg dosing.  If she has any side effects, she is, call.  Will plan for follow-up after we get the above results. - escitalopram (LEXAPRO) 10 MG tablet; Take 1 tablet (10 mg total) by mouth daily.  Dispense: 30 tablet; Refill: 2  5. Encounter for screening mammogram for malignant neoplasm of breast -Order for mammogram placed.  She knows to  call for scheduling. - MM 3D SCREENING MAMMOGRAM BILATERAL BREAST; Future   Orders Placed This Encounter  Procedures   MM 3D SCREENING MAMMOGRAM BILATERAL BREAST   Follicle stimulating hormone   Estradiol   Prolactin   Beta hCG quant (ref lab)    Meds:  Meds ordered this encounter  Medications   escitalopram (LEXAPRO) 10 MG tablet    Sig: Take 1 tablet (10 mg total) by mouth daily.    Dispense:  30 tablet    Refill:  2    Follow-up: pending above results  Ronal GORMAN Pinal, MD 02/03/2024 6:37 AM GYNECOLOGY  VISIT

## 2024-02-03 ENCOUNTER — Ambulatory Visit (HOSPITAL_BASED_OUTPATIENT_CLINIC_OR_DEPARTMENT_OTHER): Payer: Self-pay | Admitting: Obstetrics & Gynecology

## 2024-02-03 LAB — PROLACTIN: Prolactin: 9.7 ng/mL (ref 4.8–33.4)

## 2024-02-03 LAB — FOLLICLE STIMULATING HORMONE: FSH: 13.2 m[IU]/mL

## 2024-02-03 LAB — ESTRADIOL: Estradiol: 33.2 pg/mL

## 2024-02-03 LAB — BETA HCG QUANT (REF LAB): hCG Quant: <1 m[IU]/mL

## 2024-02-03 MED ORDER — MEDROXYPROGESTERONE ACETATE 10 MG PO TABS: 10.0000 mg | ORAL_TABLET | Freq: Every day | ORAL | 0 refills | Status: AC

## 2024-02-09 LAB — CYTOLOGY - PAP
Comment: NEGATIVE
Diagnosis: NEGATIVE

## 2024-03-02 ENCOUNTER — Inpatient Hospital Stay
Admission: RE | Admit: 2024-03-02 | Discharge: 2024-03-02 | Attending: Obstetrics & Gynecology | Admitting: Obstetrics & Gynecology

## 2024-03-02 DIAGNOSIS — Z1231 Encounter for screening mammogram for malignant neoplasm of breast: Secondary | ICD-10-CM
